# Patient Record
Sex: Female | Born: 1995 | Race: White | Hispanic: No | Marital: Single | Smoking: Never smoker
Health system: Southern US, Community
[De-identification: ages and names within clinical notes are randomized; demographics above are authoritative.]

## PROBLEM LIST (undated history)

## (undated) DIAGNOSIS — K219 Gastro-esophageal reflux disease without esophagitis: Secondary | ICD-10-CM

## (undated) DIAGNOSIS — J45909 Unspecified asthma, uncomplicated: Secondary | ICD-10-CM

## (undated) DIAGNOSIS — T7840XA Allergy, unspecified, initial encounter: Secondary | ICD-10-CM

## (undated) DIAGNOSIS — G43909 Migraine, unspecified, not intractable, without status migrainosus: Secondary | ICD-10-CM

## (undated) DIAGNOSIS — Z87440 Personal history of urinary (tract) infections: Secondary | ICD-10-CM

## (undated) DIAGNOSIS — R519 Headache, unspecified: Secondary | ICD-10-CM

## (undated) DIAGNOSIS — K921 Melena: Secondary | ICD-10-CM

## (undated) DIAGNOSIS — B019 Varicella without complication: Secondary | ICD-10-CM

## (undated) HISTORY — PX: WISDOM TOOTH EXTRACTION: SHX21

## (undated) HISTORY — DX: Melena: K92.1

## (undated) HISTORY — DX: Gastro-esophageal reflux disease without esophagitis: K21.9

## (undated) HISTORY — DX: Unspecified asthma, uncomplicated: J45.909

## (undated) HISTORY — DX: Headache, unspecified: R51.9

## (undated) HISTORY — DX: Varicella without complication: B01.9

## (undated) HISTORY — PX: MOLE REMOVAL: SHX2046

## (undated) HISTORY — PX: UPPER GASTROINTESTINAL ENDOSCOPY: SHX188

## (undated) HISTORY — DX: Personal history of urinary (tract) infections: Z87.440

## (undated) HISTORY — DX: Allergy, unspecified, initial encounter: T78.40XA

## (undated) HISTORY — DX: Migraine, unspecified, not intractable, without status migrainosus: G43.909

---
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
## 2017-01-20 DIAGNOSIS — J029 Acute pharyngitis, unspecified: Secondary | ICD-10-CM | POA: Diagnosis not present

## 2017-01-27 DIAGNOSIS — K006 Disturbances in tooth eruption: Secondary | ICD-10-CM | POA: Diagnosis not present

## 2017-01-27 DIAGNOSIS — K011 Impacted teeth: Secondary | ICD-10-CM | POA: Diagnosis not present

## 2017-06-16 DIAGNOSIS — Z111 Encounter for screening for respiratory tuberculosis: Secondary | ICD-10-CM | POA: Diagnosis not present

## 2017-06-17 DIAGNOSIS — K080 Exfoliation of teeth due to systemic causes: Secondary | ICD-10-CM | POA: Diagnosis not present

## 2017-06-23 DIAGNOSIS — K080 Exfoliation of teeth due to systemic causes: Secondary | ICD-10-CM | POA: Diagnosis not present

## 2017-06-28 DIAGNOSIS — Z0000 Encounter for general adult medical examination without abnormal findings: Secondary | ICD-10-CM | POA: Diagnosis not present

## 2017-06-28 DIAGNOSIS — Z111 Encounter for screening for respiratory tuberculosis: Secondary | ICD-10-CM | POA: Diagnosis not present

## 2017-08-05 DIAGNOSIS — L255 Unspecified contact dermatitis due to plants, except food: Secondary | ICD-10-CM | POA: Diagnosis not present

## 2017-09-07 DIAGNOSIS — L2389 Allergic contact dermatitis due to other agents: Secondary | ICD-10-CM | POA: Diagnosis not present

## 2017-11-25 DIAGNOSIS — Z5181 Encounter for therapeutic drug level monitoring: Secondary | ICD-10-CM | POA: Diagnosis not present

## 2017-11-25 DIAGNOSIS — Z23 Encounter for immunization: Secondary | ICD-10-CM | POA: Diagnosis not present

## 2017-11-25 DIAGNOSIS — D2271 Melanocytic nevi of right lower limb, including hip: Secondary | ICD-10-CM | POA: Diagnosis not present

## 2017-11-25 DIAGNOSIS — D225 Melanocytic nevi of trunk: Secondary | ICD-10-CM | POA: Diagnosis not present

## 2017-11-25 DIAGNOSIS — L700 Acne vulgaris: Secondary | ICD-10-CM | POA: Diagnosis not present

## 2018-01-27 DIAGNOSIS — K080 Exfoliation of teeth due to systemic causes: Secondary | ICD-10-CM | POA: Diagnosis not present

## 2018-02-11 DIAGNOSIS — L700 Acne vulgaris: Secondary | ICD-10-CM | POA: Diagnosis not present

## 2018-06-10 DIAGNOSIS — K30 Functional dyspepsia: Secondary | ICD-10-CM | POA: Diagnosis not present

## 2018-06-29 DIAGNOSIS — R1013 Epigastric pain: Secondary | ICD-10-CM | POA: Diagnosis not present

## 2018-06-29 DIAGNOSIS — R143 Flatulence: Secondary | ICD-10-CM | POA: Diagnosis not present

## 2018-08-03 DIAGNOSIS — K922 Gastrointestinal hemorrhage, unspecified: Secondary | ICD-10-CM | POA: Diagnosis not present

## 2018-09-02 DIAGNOSIS — B078 Other viral warts: Secondary | ICD-10-CM | POA: Diagnosis not present

## 2018-09-02 DIAGNOSIS — D2239 Melanocytic nevi of other parts of face: Secondary | ICD-10-CM | POA: Diagnosis not present

## 2018-09-02 DIAGNOSIS — D763 Other histiocytosis syndromes: Secondary | ICD-10-CM | POA: Diagnosis not present

## 2018-09-02 DIAGNOSIS — D485 Neoplasm of uncertain behavior of skin: Secondary | ICD-10-CM | POA: Diagnosis not present

## 2018-09-09 DIAGNOSIS — L918 Other hypertrophic disorders of the skin: Secondary | ICD-10-CM | POA: Diagnosis not present

## 2018-09-28 DIAGNOSIS — R05 Cough: Secondary | ICD-10-CM | POA: Diagnosis not present

## 2018-09-28 DIAGNOSIS — Z20828 Contact with and (suspected) exposure to other viral communicable diseases: Secondary | ICD-10-CM | POA: Diagnosis not present

## 2018-09-28 DIAGNOSIS — R0602 Shortness of breath: Secondary | ICD-10-CM | POA: Diagnosis not present

## 2018-10-13 ENCOUNTER — Telehealth: Payer: Self-pay

## 2018-10-13 NOTE — Telephone Encounter (Signed)

## 2018-10-14 ENCOUNTER — Other Ambulatory Visit: Payer: Self-pay

## 2018-10-14 ENCOUNTER — Ambulatory Visit (INDEPENDENT_AMBULATORY_CARE_PROVIDER_SITE_OTHER): Payer: Federal, State, Local not specified - PPO | Admitting: Plastic Surgery

## 2018-10-14 ENCOUNTER — Encounter: Payer: Self-pay | Admitting: Plastic Surgery

## 2018-10-14 DIAGNOSIS — L988 Other specified disorders of the skin and subcutaneous tissue: Secondary | ICD-10-CM

## 2018-10-14 NOTE — Progress Notes (Signed)
     Patient ID: Melissa Rivas, female    DOB: December 08, 1995, 23 y.o.   MRN: [id number]   Chief Complaint  Patient presents with  . Skin Problem    The patient is a 23 year old female here with her parents for evaluation of a skin lesion.  She had a changing skin lesion for several months to a year.  She was seen by dermatology and a biopsy was done.  It was found to be dysplastic.  It was less than a centimeter in size.  The pigment has returned.  It is 5 mm in size and located on the right cheek inferior to the earlobe.  There is a family history of skin cancer.  She is otherwise in excellent health.  She is a Electronics engineer at [employer].   Review of Systems  Constitutional: Negative for activity change and appetite change.  HENT: Negative.   Eyes: Negative.   Respiratory: Negative.  Negative for chest tightness and shortness of breath.   Cardiovascular: Negative for leg swelling.  Gastrointestinal: Negative for abdominal pain.  Endocrine: Negative.   Genitourinary: Negative.   Musculoskeletal: Negative.   Skin: Positive for color change.  Neurological: Negative.   Psychiatric/Behavioral: Negative.     History reviewed. No pertinent past medical history.  History reviewed. No pertinent surgical history.    Current Outpatient Medications:  .  famotidine (PEPCID) 20 MG tablet, Take 20 mg by mouth 2 (two) times daily., Disp: , Rfl:  .  norgestimate-ethinyl estradiol (ORTHO-CYCLEN) 0.25-35 MG-MCG tablet, Take 1 tablet by mouth daily., Disp: , Rfl:    Objective:   Vitals:   10/14/18 1346  BP: 115/80  Pulse: 74  Temp: 97.9 F (36.6 C)  SpO2: 98%    Physical Exam Vitals signs and nursing note reviewed.  Constitutional:      Appearance: Normal appearance.  HENT:     Head: Normocephalic and atraumatic.  Cardiovascular:     Rate and Rhythm: Normal rate.  Pulmonary:     Effort: Pulmonary effort is normal.  Abdominal:     General: Abdomen is flat. There is no distension.   Musculoskeletal: Normal range of motion.  Skin:    General: Skin is warm.  Neurological:     General: No focal deficit present.     Mental Status: She is alert and oriented to person, place, and time.  Psychiatric:        Mood and Affect: Mood normal.        Behavior: Behavior normal.        Thought Content: Thought content normal.     Assessment & Plan:  Dysplasia of lesion of skin  Plan for excision of dysplastic skin lesion in the clinic. Pictures were obtained of the patient and placed in the chart with the patient's or guardian's permission.  Hagan, DO

## 2018-11-24 ENCOUNTER — Telehealth: Payer: Self-pay

## 2018-11-24 NOTE — Telephone Encounter (Signed)

## 2018-11-25 ENCOUNTER — Encounter: Payer: Self-pay | Admitting: Plastic Surgery

## 2018-11-25 ENCOUNTER — Other Ambulatory Visit: Payer: Self-pay

## 2018-11-25 ENCOUNTER — Other Ambulatory Visit (HOSPITAL_COMMUNITY)
Admission: RE | Admit: 2018-11-25 | Discharge: 2018-11-25 | Disposition: A | Discharge status: Home / Self Care | Payer: Federal, State, Local not specified - PPO | Source: Ambulatory Visit | Attending: Plastic Surgery | Admitting: Plastic Surgery

## 2018-11-25 ENCOUNTER — Ambulatory Visit: Payer: Federal, State, Local not specified - PPO | Admitting: Plastic Surgery

## 2018-11-25 VITALS — BP 121/82 | HR 80 | Temp 98.0°F | Ht 68.0 in | Wt 150.6 lb

## 2018-11-25 DIAGNOSIS — L988 Other specified disorders of the skin and subcutaneous tissue: Secondary | ICD-10-CM | POA: Insufficient documentation

## 2018-11-25 DIAGNOSIS — D2339 Other benign neoplasm of skin of other parts of face: Secondary | ICD-10-CM | POA: Diagnosis not present

## 2018-11-25 NOTE — Progress Notes (Signed)
Procedure Note  Preoperative Dx: Dysplastic nevus right cheek  Postoperative Dx: Same  Procedure: Excision of dysplastic nevus right cheek 7 mm  Anesthesia: Lidocaine 1% with 1:100,000 epinepherine   Description of Procedure: Risks and complications were explained to the patient and mom.  Consent was confirmed and the patient understands the risks and benefits.  The potential complications and alternatives were explained and the patient consents.  The patient expressed understanding the option of not having the procedure and the risks of a scar.  Time out was called and all information was confirmed to be correct.    The area was prepped and drapped.  Lidocaine 1% with epinepherine was injected in the subcutaneous area.  After waiting several minutes for the local to take affect a #15 blade was used to excise the area in an eliptical pattern with 38mm borders from the biopsy site.  A 6-0 Monocryl was used to close the deep layers with simple interrupted stitches.  The skin edges were reapproximated with 5-0 Monocryl subcuticular running closure.  A dressing was applied.  The patient was given instructions on how to care for the area and a follow up appointment.  Xitlally tolerated the procedure well and there were no complications. The specimen was sent to pathology.

## 2018-11-29 LAB — SURGICAL PATHOLOGY

## 2018-11-30 ENCOUNTER — Telehealth: Payer: Self-pay | Admitting: Plastic Surgery

## 2018-11-30 NOTE — Telephone Encounter (Signed)
Mother, Alysson Geist, called to see if daughter's biopsy results are in yet. Please call and advise what results are. FYI- No HIPAA on file for mom for our office, but mom's # is [phone].

## 2018-12-01 ENCOUNTER — Telehealth: Payer: Self-pay

## 2018-12-01 NOTE — Telephone Encounter (Signed)
Call to pt's mom re: path report I informed her of the results per Dr. Dillingham-  & instructed her that Dr. Dillingham advised to observe the area for any changes & if changes are noted in the lesion tip- that she could re-excise that area at any time- & that there was nothing to be concerned with at this time Pt has f/u appointment on 12/02/18 with Matt- our PA Tarissa Kerin 

## 2018-12-01 NOTE — Telephone Encounter (Signed)
-----   Message from Wallace Going, DO sent at 12/01/2018 10:31 AM EDT ----- Can you let patient know following:  We got all but a small area at the tip.  Still nothing to worry about.  We can keep an eye on it.  If there is any skin change we should excise.  Otherwise we can do it at anytime.

## 2018-12-02 ENCOUNTER — Other Ambulatory Visit: Payer: Self-pay

## 2018-12-02 ENCOUNTER — Ambulatory Visit (INDEPENDENT_AMBULATORY_CARE_PROVIDER_SITE_OTHER): Payer: Federal, State, Local not specified - PPO | Admitting: Surgical

## 2018-12-02 ENCOUNTER — Encounter: Payer: Self-pay | Admitting: Surgical

## 2018-12-02 VITALS — BP 119/83 | HR 73 | Temp 98.6°F | Ht 68.0 in | Wt 152.6 lb

## 2018-12-02 DIAGNOSIS — L988 Other specified disorders of the skin and subcutaneous tissue: Secondary | ICD-10-CM

## 2018-12-02 NOTE — Progress Notes (Signed)
   Subjective:     Patient ID: Melissa Rivas, female    DOB: 03-04-1995, 23 y.o.   MRN: [id number]  Chief Complaint  Patient presents with  . Follow-up    from office excision    HPI: The patient is a 23 y.o. female here for follow-up after excision of dysplastic nevus of right cheek on 11/25/18 with Dr. Marla Roe. Patient is here with her mom.  Pathology showed: Residual nests and confluent areas of junctional melanocytes with subjacent scar are present, consistent with residual/persistent dysplastic nevus, extending to one tip and cross-sectional margins.   Patient healing well, incision c/d/i. Steri-strip in place. No surrounding erythema, dehiscence noted.  They are going for a check up with with dermatology for other lesions in December.  Review of Systems  Constitutional: Negative for activity change, appetite change, chills, diaphoresis, fatigue and fever.  HENT: Negative for tinnitus.   Skin: Negative for color change, pallor, rash and wound.  Neurological: Negative for dizziness, weakness and headaches.    Objective:   Vital Signs BP 119/83 (BP Location: Left Arm, Patient Position: Sitting, Cuff Size: Normal)   Pulse 73   Temp 98.6 F (37 C) (Temporal)   Ht 5\' 8"  (1.727 m)   Wt 152 lb 9.6 oz (69.2 kg)   LMP 11/14/2018 (Exact Date)   SpO2 98%   BMI 23.20 kg/m  Vital Signs and Nursing Note Reviewed  Physical Exam  Constitutional: She is oriented to person, place, and time and well-developed, well-nourished, and in no distress.  HENT:  Head: Normocephalic and atraumatic.    Cardiovascular: Normal rate.  Pulmonary/Chest: Effort normal.  Musculoskeletal: Normal range of motion.  Neurological: She is alert and oriented to person, place, and time. Gait normal.  Skin: Skin is warm and dry. No rash noted. She is not diaphoretic. No erythema. No pallor.  Psychiatric: Mood and affect normal.    Assessment/Plan:     ICD-10-CM   1. Dysplasia of lesion of skin   L98.8    Melissa Rivas is healing well.   Pathology showed residual dysplastic nevus cells. Patient has two options in regards to further management. We can wait and see for skin changes or move forward with additional excision to try and excise previously left dysplastic cells.  Mother and Patient are going to think it over and call us if they decide they would like to excise within the next few weeks or wait. Either plan will be appropriate, plan discussed with Dr. Marla Roe.  Vaseline to incision for 3-4 days.  Follow up as needed, call with decision/questions/concerns.   Carola Rhine Shanquita Ronning, PA-C 12/02/2018, 3:25 PM

## 2018-12-02 NOTE — Telephone Encounter (Signed)
Call to pt's mom re: path report I informed her of the results per Dr. Marla Roe-  & instructed her that Dr. Marla Roe advised to observe the area for any changes & if changes are noted in the lesion tip- that she could re-excise that area at any time- & that there was nothing to be concerned with at this time Pt has f/u appointment on 12/02/18 with Catalina Antigua- our [place]

## 2019-01-10 DIAGNOSIS — D280 Benign neoplasm of vulva: Secondary | ICD-10-CM | POA: Diagnosis not present

## 2019-01-10 DIAGNOSIS — N9089 Other specified noninflammatory disorders of vulva and perineum: Secondary | ICD-10-CM | POA: Diagnosis not present

## 2019-01-11 DIAGNOSIS — D485 Neoplasm of uncertain behavior of skin: Secondary | ICD-10-CM | POA: Diagnosis not present

## 2019-01-11 DIAGNOSIS — D225 Melanocytic nevi of trunk: Secondary | ICD-10-CM | POA: Diagnosis not present

## 2019-01-11 DIAGNOSIS — Z86018 Personal history of other benign neoplasm: Secondary | ICD-10-CM | POA: Diagnosis not present

## 2019-01-11 DIAGNOSIS — B078 Other viral warts: Secondary | ICD-10-CM | POA: Diagnosis not present

## 2019-01-11 DIAGNOSIS — D2272 Melanocytic nevi of left lower limb, including hip: Secondary | ICD-10-CM | POA: Diagnosis not present

## 2019-01-11 DIAGNOSIS — R234 Changes in skin texture: Secondary | ICD-10-CM | POA: Diagnosis not present

## 2019-01-24 ENCOUNTER — Encounter: Payer: Self-pay | Admitting: Physician Assistant

## 2019-01-24 ENCOUNTER — Ambulatory Visit: Payer: Federal, State, Local not specified - PPO | Admitting: Family Medicine

## 2019-01-24 ENCOUNTER — Other Ambulatory Visit: Payer: Self-pay

## 2019-01-24 ENCOUNTER — Encounter: Payer: Self-pay | Admitting: Family Medicine

## 2019-01-24 VITALS — BP 121/81 | HR 88 | Temp 99.1°F | Resp 16 | Ht 68.0 in | Wt 151.2 lb

## 2019-01-24 DIAGNOSIS — Z7689 Persons encountering health services in other specified circumstances: Secondary | ICD-10-CM

## 2019-01-24 DIAGNOSIS — R519 Headache, unspecified: Secondary | ICD-10-CM

## 2019-01-24 DIAGNOSIS — R0789 Other chest pain: Secondary | ICD-10-CM

## 2019-01-24 DIAGNOSIS — R194 Change in bowel habit: Secondary | ICD-10-CM | POA: Diagnosis not present

## 2019-01-24 DIAGNOSIS — K219 Gastro-esophageal reflux disease without esophagitis: Secondary | ICD-10-CM

## 2019-01-24 MED ORDER — SACCHAROMYCES BOULARDII 250 MG PO CAPS: 250.0000 mg | ORAL_CAPSULE | Freq: Two times a day (BID) | ORAL | 2 refills | Status: DC

## 2019-01-24 MED ORDER — FAMOTIDINE 20 MG PO TABS: 20.0000 mg | ORAL_TABLET | Freq: Two times a day (BID) | ORAL | 1 refills | Status: DC

## 2019-01-24 MED ORDER — OMEPRAZOLE 20 MG PO CPDR: 20.0000 mg | DELAYED_RELEASE_CAPSULE | Freq: Every day | ORAL | 2 refills | Status: DC

## 2019-01-24 NOTE — Patient Instructions (Addendum)
1. Start florastor probiotic every 12 hours 2. Avoid dairy  3. Restart omeprazole.  4. Continue famotidine.  5. Try low fodmap diet. Follow up in 4 weeks.    Food Choices for Gastroesophageal Reflux Disease, Adult When you have gastroesophageal reflux disease (GERD), the foods you eat and your eating habits are very important. Choosing the right foods can help ease your discomfort. Think about working with a nutrition specialist (dietitian) to help you make good choices. What are tips for following this plan?  Meals  Choose healthy foods that are low in fat, such as fruits, vegetables, whole grains, low-fat dairy products, and lean meat, fish, and poultry.  Eat small meals often instead of 3 large meals a day. Eat your meals slowly, and in a place where you are relaxed. Avoid bending over or lying down until 2-3 hours after eating.  Avoid eating meals 2-3 hours before bed.  Avoid drinking a lot of liquid with meals.  Cook foods using methods other than frying. Bake, grill, or broil food instead.  Avoid or limit: ? Chocolate. ? Peppermint or spearmint. ? Alcohol. ? Pepper. ? Black and decaffeinated coffee. ? Black and decaffeinated tea. ? Bubbly (carbonated) soft drinks. ? Caffeinated energy drinks and soft drinks.  Limit high-fat foods such as: ? Fatty meat or fried foods. ? Whole milk, cream, butter, or ice cream. ? Nuts and nut butters. ? Pastries, donuts, and sweets made with butter or shortening.  Avoid foods that cause symptoms. These foods may be different for everyone. Common foods that cause symptoms include: ? Tomatoes. ? Oranges, lemons, and limes. ? Peppers. ? Spicy food. ? Onions and garlic. ? Vinegar. Lifestyle  Maintain a healthy weight. Ask your doctor what weight is healthy for you. If you need to lose weight, work with your doctor to do so safely.  Exercise for at least 30 minutes for 5 or more days each week, or as told by your doctor.  Wear  loose-fitting clothes.  Do not smoke. If you need help quitting, ask your doctor.  Sleep with the head of your bed higher than your feet. Use a wedge under the mattress or blocks under the bed frame to raise the head of the bed. Summary  When you have gastroesophageal reflux disease (GERD), food and lifestyle choices are very important in easing your symptoms.  Eat small meals often instead of 3 large meals a day. Eat your meals slowly, and in a place where you are relaxed.  Limit high-fat foods such as fatty meat or fried foods.  Avoid bending over or lying down until 2-3 hours after eating.  Avoid peppermint and spearmint, caffeine, alcohol, and chocolate. This information is not intended to replace advice given to you by your health care provider. Make sure you discuss any questions you have with your health care provider. Document Released: 07/28/2011 Document Revised: 05/19/2018 Document Reviewed: 03/03/2016 Elsevier Patient Education  2020 [employer]   Low-FODMAP Eating Plan  FODMAPs (fermentable oligosaccharides, disaccharides, monosaccharides, and polyols) are sugars that are hard for some people to digest. A low-FODMAP eating plan may help some people who have bowel (intestinal) diseases to manage their symptoms. This meal plan can be complicated to follow. Work with a diet and nutrition specialist (dietitian) to make a low-FODMAP eating plan that is right for you. A dietitian can make sure that you get enough nutrition from this diet. What are tips for following this plan? Reading food labels  Check labels for hidden  FODMAPs such as: ? High-fructose syrup. ? Honey. ? Agave. ? Natural fruit flavors. ? Onion or garlic powder.  Choose low-FODMAP foods that contain 3-4 grams of fiber per serving.  Check food labels for serving sizes. Eat only one serving at a time to make sure FODMAP levels stay low. Meal planning  Follow a low-FODMAP eating plan for up to 6 weeks,  or as told by your health care provider or dietitian.  To follow the eating plan: 1. Eliminate high-FODMAP foods from your diet completely. 2. Gradually reintroduce high-FODMAP foods into your diet one at a time. Most people should wait a few days after introducing one high-FODMAP food before they introduce the next high-FODMAP food. Your dietitian can recommend how quickly you may reintroduce foods. 3. Keep a daily record of what you eat and drink, and make note of any symptoms that you have after eating. 4. Review your daily record with a dietitian regularly. Your dietitian can help you identify which foods you can eat and which foods you should avoid. General tips  Drink enough fluid each day to keep your urine pale yellow.  Avoid processed foods. These often have added sugar and may be high in FODMAPs.  Avoid most dairy products, whole grains, and sweeteners.  Work with a dietitian to make sure you get enough fiber in your diet. Recommended foods Grains  Gluten-free grains, such as rice, oats, buckwheat, quinoa, corn, polenta, and millet. Gluten-free pasta, bread, or cereal. Rice noodles. Corn tortillas. Vegetables  Eggplant, zucchini, cucumber, peppers, green beans, Brussels sprouts, bean sprouts, lettuce, arugula, kale, Swiss chard, spinach, collard greens, bok choy, summer squash, potato, and tomato. Limited amounts of corn, carrot, and sweet potato. Green parts of scallions. Fruits  Bananas, oranges, lemons, limes, blueberries, raspberries, strawberries, grapes, cantaloupe, honeydew melon, kiwi, papaya, passion fruit, and pineapple. Limited amounts of dried cranberries, banana chips, and shredded coconut. Dairy  Lactose-free milk, yogurt, and kefir. Lactose-free cottage cheese and ice cream. Non-dairy milks, such as almond, coconut, hemp, and rice milk. Yogurts made of non-dairy milks. Limited amounts of goat cheese, brie, mozzarella, parmesan, swiss, and other hard  cheeses. Meats and other protein foods  Unseasoned beef, pork, poultry, or fish. Eggs. Tomasa Blase. Tofu (firm) and tempeh. Limited amounts of nuts and seeds, such as almonds, walnuts, [place] nuts, pecans, peanuts, pumpkin seeds, chia seeds, and sunflower seeds. Fats and oils  Butter-free spreads. Vegetable oils, such as olive, canola, and sunflower oil. Seasoning and other foods  Artificial sweeteners with names that do not end in "ol" such as aspartame, saccharine, and stevia. Maple syrup, white table sugar, raw sugar, brown sugar, and molasses. Fresh basil, coriander, parsley, rosemary, and thyme. Beverages  Water and mineral water. Sugar-sweetened soft drinks. Small amounts of orange juice or cranberry juice. Black and green tea. Most dry wines. Coffee. This may not be a complete list of low-FODMAP foods. Talk with your dietitian for more information. Foods to avoid Grains  Wheat, including kamut, durum, and semolina. Barley and bulgur. Couscous. Wheat-based cereals. Wheat noodles, bread, crackers, and pastries. Vegetables  Chicory root, artichoke, asparagus, cabbage, snow peas, sugar snap peas, mushrooms, and cauliflower. Onions, garlic, leeks, and the white part of scallions. Fruits  Fresh, dried, and juiced forms of apple, pear, watermelon, peach, plum, cherries, apricots, blackberries, boysenberries, figs, nectarines, and mango. Avocado. Dairy  Milk, yogurt, ice cream, and soft cheese. Cream and sour cream. Milk-based sauces. Custard. Meats and other protein foods  Fried or fatty meat. Sausage. Cashews and  pistachios. Soybeans, baked beans, black beans, chickpeas, kidney beans, fava beans, navy beans, lentils, and split peas. Seasoning and other foods  Any sugar-free gum or candy. Foods that contain artificial sweeteners such as sorbitol, mannitol, isomalt, or xylitol. Foods that contain honey, high-fructose corn syrup, or agave. Bouillon, vegetable stock, beef stock, and chicken  stock. Garlic and onion powder. Condiments made with onion, such as hummus, chutney, pickles, relish, salad dressing, and salsa. Tomato paste. Beverages  Chicory-based drinks. Coffee substitutes. Chamomile tea. Fennel tea. Sweet or fortified wines such as port or sherry. Diet soft drinks made with isomalt, mannitol, maltitol, sorbitol, or xylitol. Apple, pear, and mango juice. Juices with high-fructose corn syrup. This may not be a complete list of high-FODMAP foods. Talk with your dietitian to discuss what dietary choices are best for you.  Summary  A low-FODMAP eating plan is a short-term diet that eliminates FODMAPs from your diet to help ease symptoms of certain bowel diseases.  The eating plan usually lasts up to 6 weeks. After that, high-FODMAP foods are restarted gradually, one at a time, so you can find out which may be causing symptoms.  A low-FODMAP eating plan can be complicated. It is best to work with a dietitian who has experience with this type of plan. This information is not intended to replace advice given to you by your health care provider. Make sure you discuss any questions you have with your health care provider. Document Released: 09/22/2016 Document Revised: 01/08/2017 Document Reviewed: 09/22/2016 Elsevier Patient Education  2020 [employer].

## 2019-01-24 NOTE — Progress Notes (Signed)
Patient ID: Melissa Rivas, female  DOB: [birth year], 23 y.o.   MRN: [id number] Patient Care Team    Relationship Specialty Notifications Start End  Natalia LeatherwoodKuneff, Huntington Leverich A, DO PCP - General Family Medicine  01/24/19     Chief Complaint  Patient presents with  . Establish Care    Prior [facility], [facility]- [facility], Pt had GI MD for GERD but he has retired.     Subjective:  Melissa Rivas is a 23 y.o.  female present for new patient establishment. All past medical history, surgical history, allergies, family history, immunizations, medications and social history were updated in the electronic medical record today. All recent labs, ED visits and hospitalizations within the last year were reviewed.  Chest discomfort: Patient reports she has had increasing chest discomfort.  She reports shooting pains in her chest when walking.  She reports increased symptoms and "pressure "after eating dinner.  She feels the pressure goes up into her throat.  She has noticed an increase in indigestion, but denies routine heartburn.  She endorses wine will cause burning in her stomach and increase her symptoms.  [facility] 1.5 years ago and [facility].  She states she was started on omeprazole for a few months.  When she went to GI they had started her on famotidine.  She does not recall using these medications subcutaneously.  She does endorse also having fluctuating bowel movements between loose stools and constipation, without many normal stools reported.  She denies any fever, chills, nausea or vomit.  She denies any shortness of breath or radiation of chest pain.  She denies any unintentional weight loss.  She does endorse having some anxiety.  She is in college and reports stress surrounding school work and the pandemic.  She thinks she is coping okay to the anxiety.  She also has a history of asthma without use of albuterol or daily  inhaler. Patient also reports a history of migraines that might be becoming more frequent.  She also reports having a occasional "."in the center of her visual field.  She denies any other changes in her vision or light sensitivity.  Depression screen PHQ 2/9 01/24/2019  Decreased Interest 0  Down, Depressed, Hopeless 0  PHQ - 2 Score 0   GAD 7 : Generalized Anxiety Score 01/24/2019  Nervous, Anxious, on Edge 0  Control/stop worrying 0  Worry too much - different things 0  Trouble relaxing 0  Restless 0  Easily annoyed or irritable 0  Afraid - awful might happen 0  Total GAD 7 Score 0  Anxiety Difficulty Not difficult at all       No flowsheet data found.  Immunization History  Administered Date(s) Administered  . Influenza-Unspecified 11/10/2018  . Tdap 07/01/2009   No exam data present  Past Medical History:  Diagnosis Date  . Allergy   . Asthma    Childhood   . Blood in stool   . Chicken pox   . Frequent headaches   . GERD (gastroesophageal reflux disease)   . History of UTI   . Migraines    Allergies  Allergen Reactions  . Azithromycin Nausea And Vomiting   Past Surgical History:  Procedure Laterality Date  . MOLE REMOVAL    . WISDOM TOOTH EXTRACTION     Family History  Problem Relation Age of Onset  . Arthritis Mother   . Asthma Father   .  Arthritis Father   . Diabetes Father   . Hyperlipidemia Father   . Hypertension Father   . Arthritis Maternal Grandmother   . Stroke Maternal Grandmother   . Arthritis Maternal Grandfather   . Cancer Maternal Grandfather   . Prostate cancer Maternal Grandfather   . Bone cancer Maternal Grandfather   . Heart disease Maternal Grandfather   . Hyperlipidemia Maternal Grandfather   . Hypertension Maternal Grandfather   . Arthritis Paternal Grandmother   . Asthma Paternal Grandmother   . Mental illness Paternal Grandmother    Social History   Social History Narrative  . Not on file    Allergies as of  01/24/2019       Reactions   Azithromycin Nausea And Vomiting        Medication List        Accurate as of January 24, 2019  1:22 PM. If you have any questions, ask your nurse or doctor.          famotidine 20 MG tablet Commonly known as: PEPCID Take 20 mg by mouth 2 (two) times daily.   lactase 3000 units tablet Commonly known as: LACTAID Take by mouth 3 (three) times daily with meals.   norgestimate-ethinyl estradiol 0.25-35 MG-MCG tablet Commonly known as: ORTHO-CYCLEN Take 1 tablet by mouth daily.   simethicone 125 MG chewable tablet Commonly known as: MYLICON Chew 250 mg by mouth every 6 (six) hours as needed for flatulence.   tretinoin 0.025 % gel Commonly known as: RETIN-A Apply topically at bedtime.        All past medical history, surgical history, allergies, family history, immunizations andmedications were updated in the EMR today and reviewed under the history and medication portions of their EMR.     No results found.   ROS: 14 pt review of systems performed and negative (unless mentioned in an HPI)  Objective: BP 121/81 (BP Location: Left Arm, Patient Position: Sitting, Cuff Size: Normal)   Pulse 88   Temp 99.1 F (37.3 C) (Temporal)   Resp 16   Ht 5\' 8"  (1.727 m)   Wt 151 lb 4 oz (68.6 kg)   LMP 01/11/2019 (Exact Date)   SpO2 97%   BMI 23.00 kg/m  Gen: Afebrile. No acute distress. Nontoxic in appearance, well-developed, well-nourished, pleasant, Caucasian female HENT: AT. .  Eyes:Pupils Equal Round Reactive to light, Extraocular movements intact,  Conjunctiva without redness, discharge or icterus. Neck/lymp/endocrine: Supple,no lymphadenopathy, no thyromegaly CV: RRR  Chest: CTAB, no wheeze, rhonchi or crackles. Abd: Soft. NTND. BS present.  Skin:  Warm and well-perfused. Skin intact. Neuro/Msk:  Normal gait. PERLA. EOMi. Alert. Oriented x3.   Psych: mildly anxious. Normal affect, dress and demeanor. Normal speech. Normal thought  content and judgment.  Assessment/plan: Melissa Rivas is a 23 y.o. female present for est/gerd.  Gastroesophageal reflux disease, unspecified whether esophagitis present/Chest discomfort Suspect her symptoms are worsening reflux.  We will increase her therapy regimen.  Have her follow a GERD diet Restart omeprazole and continue Pepcid. GERD diet discussed and provided to patient. Chest discomfort when walking may be from her asthma more so than reflux.  It does not seem like she is prescribed a controller inhaler or uses her albuterol. We will collect H. pylori IgG at upcoming physical, and if positive would treat since she reports no prior history of H. pylori.  Bowel habit changes Possibly irritable bowel syndrome.  Sounds like she fluctuates between constipation and diarrhea.   start with Florastor probiotic  which was prescribed for her today and encouraged FODMAP diet. Avoidance of all dairy for now. She has an upcoming CPE this week will check her thyroid panel at that time.  Headaches: Was not able to cover her headaches in full detail today.  Encouraged her to make a headache diary.  Also encouraged her to see her eye doctor soon as possible given eye symptom as a first step.  Follow-up in 4 weeks.  If symptoms are not improving would consider further laboratory evaluation at that time   Greater than 45 minutes was spent with patient, greater than 50% of that time was spent face-to-face     Note is dictated utilizing voice recognition software. Although note has been proof read prior to signing, occasional typographical errors still can be missed. If any questions arise, please do not hesitate to call for verification.  Electronically signed by: Howard Pouch, DO [place]

## 2019-01-27 ENCOUNTER — Ambulatory Visit (INDEPENDENT_AMBULATORY_CARE_PROVIDER_SITE_OTHER): Payer: Federal, State, Local not specified - PPO | Admitting: Family Medicine

## 2019-01-27 ENCOUNTER — Other Ambulatory Visit: Payer: Self-pay

## 2019-01-27 ENCOUNTER — Encounter: Payer: Self-pay | Admitting: Family Medicine

## 2019-01-27 VITALS — BP 115/81 | HR 86 | Temp 97.9°F | Resp 16 | Ht 68.0 in | Wt 150.1 lb

## 2019-01-27 DIAGNOSIS — F418 Other specified anxiety disorders: Secondary | ICD-10-CM

## 2019-01-27 DIAGNOSIS — Z131 Encounter for screening for diabetes mellitus: Secondary | ICD-10-CM

## 2019-01-27 DIAGNOSIS — R1013 Epigastric pain: Secondary | ICD-10-CM

## 2019-01-27 DIAGNOSIS — J4520 Mild intermittent asthma, uncomplicated: Secondary | ICD-10-CM

## 2019-01-27 DIAGNOSIS — R0789 Other chest pain: Secondary | ICD-10-CM | POA: Diagnosis not present

## 2019-01-27 DIAGNOSIS — K219 Gastro-esophageal reflux disease without esophagitis: Secondary | ICD-10-CM | POA: Diagnosis not present

## 2019-01-27 DIAGNOSIS — Z130 Encounter for screening for diseases of the blood and blood-forming organs and certain disorders involving the immune mechanism: Secondary | ICD-10-CM | POA: Diagnosis not present

## 2019-01-27 DIAGNOSIS — Z23 Encounter for immunization: Secondary | ICD-10-CM

## 2019-01-27 DIAGNOSIS — Z0000 Encounter for general adult medical examination without abnormal findings: Secondary | ICD-10-CM | POA: Diagnosis not present

## 2019-01-27 DIAGNOSIS — Z79899 Other long term (current) drug therapy: Secondary | ICD-10-CM

## 2019-01-27 DIAGNOSIS — R194 Change in bowel habit: Secondary | ICD-10-CM

## 2019-01-27 LAB — LIPID PANEL
Cholesterol: 218 mg/dL — ABNORMAL HIGH (ref 0–200)
HDL: 56.7 mg/dL (ref >39.00)
LDL Cholesterol: 139 mg/dL — ABNORMAL HIGH (ref 0–99)
NonHDL: 160.95
Total CHOL/HDL Ratio: 4
Triglycerides: 109 mg/dL (ref 0.0–149.0)
VLDL: 21.8 mg/dL (ref 0.0–40.0)

## 2019-01-27 LAB — COMPREHENSIVE METABOLIC PANEL
ALT: 11 U/L (ref 0–35)
AST: 14 U/L (ref 0–37)
Albumin: 4.3 g/dL (ref 3.5–5.2)
Alkaline Phosphatase: 36 U/L — ABNORMAL LOW (ref 39–117)
BUN: 11 mg/dL (ref 6–23)
CO2: 29 mEq/L (ref 19–32)
Calcium: 9.3 mg/dL (ref 8.4–10.5)
Chloride: 104 mEq/L (ref 96–112)
Creatinine, Ser: 0.86 mg/dL (ref 0.40–1.20)
GFR: 83.22 mL/min (ref >60.00)
Glucose, Bld: 86 mg/dL (ref 70–99)
Potassium: 4.3 mEq/L (ref 3.5–5.1)
Sodium: 138 mEq/L (ref 135–145)
Total Bilirubin: 0.4 mg/dL (ref 0.2–1.2)
Total Protein: 6.8 g/dL (ref 6.0–8.3)

## 2019-01-27 LAB — CBC
HCT: 39.4 % (ref 36.0–46.0)
Hemoglobin: 13 g/dL (ref 12.0–15.0)
MCHC: 33 g/dL (ref 30.0–36.0)
MCV: 88.9 fl (ref 78.0–100.0)
Platelets: 260 10*3/uL (ref 150.0–400.0)
RBC: 4.43 Mil/uL (ref 3.87–5.11)
RDW: 12.6 % (ref 11.5–15.5)
WBC: 5.3 10*3/uL (ref 4.0–10.5)

## 2019-01-27 LAB — TSH: TSH: 1.89 u[IU]/mL (ref 0.35–4.50)

## 2019-01-27 LAB — H. PYLORI ANTIBODY, IGG: H Pylori IgG: NEGATIVE

## 2019-01-27 LAB — HEMOGLOBIN A1C: Hgb A1c MFr Bld: 5.3 % (ref 4.6–6.5)

## 2019-01-27 NOTE — Patient Instructions (Signed)
Health Maintenance, Female Adopting a healthy lifestyle and getting preventive care are important in promoting health and wellness. Ask your health care provider about:  The right schedule for you to have regular tests and exams.  Things you can do on your own to prevent diseases and keep yourself healthy. What should I know about diet, weight, and exercise? Eat a healthy diet   Eat a diet that includes plenty of vegetables, fruits, low-fat dairy products, and lean protein.  Do not eat a lot of foods that are high in solid fats, added sugars, or sodium. Maintain a healthy weight Body mass index (BMI) is used to identify weight problems. It estimates body fat based on height and weight. Your health care provider can help determine your BMI and help you achieve or maintain a healthy weight. Get regular exercise Get regular exercise. This is one of the most important things you can do for your health. Most adults should:  Exercise for at least 150 minutes each week. The exercise should increase your heart rate and make you sweat (moderate-intensity exercise).  Do strengthening exercises at least twice a week. This is in addition to the moderate-intensity exercise.  Spend less time sitting. Even light physical activity can be beneficial. Watch cholesterol and blood lipids Have your blood tested for lipids and cholesterol at 20 years of age, then have this test every 5 years. Have your cholesterol levels checked more often if:  Your lipid or cholesterol levels are high.  You are older than 21 years of age.  You are at high risk for heart disease. What should I know about cancer screening? Depending on your health history and family history, you may need to have cancer screening at various ages. This may include screening for:  Breast cancer.  Cervical cancer.  Colorectal cancer.  Skin cancer.  Lung cancer. What should I know about heart disease, diabetes, and high blood  pressure? Blood pressure and heart disease  High blood pressure causes heart disease and increases the risk of stroke. This is more likely to develop in people who have high blood pressure readings, are of African descent, or are overweight.  Have your blood pressure checked: ? Every 3-5 years if you are 18-39 years of age. ? Every year if you are 40 years old or older. Diabetes Have regular diabetes screenings. This checks your fasting blood sugar level. Have the screening done:  Once every three years after age 40 if you are at a normal weight and have a low risk for diabetes.  More often and at a younger age if you are overweight or have a high risk for diabetes. What should I know about preventing infection? Hepatitis B If you have a higher risk for hepatitis B, you should be screened for this virus. Talk with your health care provider to find out if you are at risk for hepatitis B infection. Hepatitis C Testing is recommended for:  Everyone born from 1945 through 1965.  Anyone with known risk factors for hepatitis C. Sexually transmitted infections (STIs)  Get screened for STIs, including gonorrhea and chlamydia, if: ? You are sexually active and are younger than 21 years of age. ? You are older than 21 years of age and your health care provider tells you that you are at risk for this type of infection. ? Your sexual activity has changed since you were last screened, and you are at increased risk for chlamydia or gonorrhea. Ask your health care provider if   you are at risk.  Ask your health care provider about whether you are at high risk for HIV. Your health care provider may recommend a prescription medicine to help prevent HIV infection. If you choose to take medicine to prevent HIV, you should first get tested for HIV. You should then be tested every 3 months for as long as you are taking the medicine. Pregnancy  If you are about to stop having your period (premenopausal) and  you may become pregnant, seek counseling before you get pregnant.  Take 400 to 800 micrograms (mcg) of folic acid every day if you become pregnant.  Ask for birth control (contraception) if you want to prevent pregnancy. Osteoporosis and menopause Osteoporosis is a disease in which the bones lose minerals and strength with aging. This can result in bone fractures. If you are 65 years old or older, or if you are at risk for osteoporosis and fractures, ask your health care provider if you should:  Be screened for bone loss.  Take a calcium or vitamin D supplement to lower your risk of fractures.  Be given hormone replacement therapy (HRT) to treat symptoms of menopause. Follow these instructions at home: Lifestyle  Do not use any products that contain nicotine or tobacco, such as cigarettes, e-cigarettes, and chewing tobacco. If you need help quitting, ask your health care provider.  Do not use street drugs.  Do not share needles.  Ask your health care provider for help if you need support or information about quitting drugs. Alcohol use  Do not drink alcohol if: ? Your health care provider tells you not to drink. ? You are pregnant, may be pregnant, or are planning to become pregnant.  If you drink alcohol: ? Limit how much you use to 0-1 drink a day. ? Limit intake if you are breastfeeding.  Be aware of how much alcohol is in your drink. In the [place] one drink equals one 12 oz bottle of beer (355 mL), one 5 oz glass of wine (148 mL), or one 1 oz glass of hard liquor (44 mL). General instructions  Schedule regular health, dental, and eye exams.  Stay current with your vaccines.  Tell your health care provider if: ? You often feel depressed. ? You have ever been abused or do not feel safe at home. Summary  Adopting a healthy lifestyle and getting preventive care are important in promoting health and wellness.  Follow your health care provider's instructions about healthy  diet, exercising, and getting tested or screened for diseases.  Follow your health care provider's instructions on monitoring your cholesterol and blood pressure. This information is not intended to replace advice given to you by your health care provider. Make sure you discuss any questions you have with your health care provider. Document Released: 08/11/2010 Document Revised: 01/19/2018 Document Reviewed: 01/19/2018 Elsevier Patient Education  2020 Elsevier Inc.  

## 2019-01-27 NOTE — Progress Notes (Signed)
This visit occurred during the SARS-CoV-2 public health emergency.  Safety protocols were in place, including screening questions prior to the visit, additional usage of staff PPE, and extensive cleaning of exam room while observing appropriate contact time as indicated for disinfecting solutions.    Patient ID: Melissa Rivas, female  DOB: 06-16-1995, 23 y.o.   MRN: [id number] Patient Care Team    Relationship Specialty Notifications Start End  Ma Hillock, DO PCP - General Family Medicine  01/24/19   Jari Pigg, MD Consulting Physician Dermatology  01/29/19   Otelia Sergeant, OD Referring Physician   01/29/19   Gastroenterology, [facility]    01/29/19     Chief Complaint  Patient presents with  . Annual Exam    Fasting. Pt is going to [facility] for GYN services     Subjective:  Melissa Rivas is a 23 y.o.  Female  present for CPE. All past medical history, surgical history, allergies, family history, immunizations, medications and social history were updated in the electronic medical record today. All recent labs, ED visits and hospitalizations within the last year were reviewed. Patient's last menstrual period was 01/11/2019 (exact date).  Health maintenance:  Colonoscopy: No fhx. Routine screen 47 Mammogram: no fhx. Routine screen 42 Cervical cancer screening: start  PAP this yr has gyn. Discussed.  Immunizations: tdap ~2011> updated today, Influenza UTD 2020 Infectious disease screening: HIV/g/c N/a never SA Assistive device: none Oxygen XEN:MMHW Patient has a Dental home. Hospitalizations/ED visits: reviewed  Depression screen [facility] 2/9 01/27/2019 01/24/2019  Decreased Interest 0 0  Down, Depressed, Hopeless 0 0  PHQ - 2 Score 0 0   GAD 7 : Generalized Anxiety Score 01/24/2019  Nervous, Anxious, on Edge 0  Control/stop worrying 0  Worry too much - different things 0  Trouble relaxing 0  Restless 0  Easily annoyed or irritable 0  Afraid - awful might happen  0  Total GAD 7 Score 0  Anxiety Difficulty Not difficult at all    Immunization History  Administered Date(s) Administered  . DTaP 03/13/1998, 05/06/1998, 07/10/1998, 04/04/1999, 02/23/2003  . Hepatitis A 10/14/2005, 06/04/2006  . Hepatitis B 07/10/1998, 09/23/1998, 01/16/2000  . HiB (PRP-OMP) 03/13/1998, 05/06/1998, 07/10/1998, 04/04/1999  . Hpv 03/10/2013, 05/11/2013, 09/11/2013  . IPV 03/13/1998, 05/06/1998, 12/31/1998, 02/23/2003  . Influenza,inj,Quad PF,6+ Mos 12/02/2018  . Influenza-Unspecified 11/10/2018  . MMR 12/31/1998, 02/23/2003  . Meningococcal Conjugate 09/21/2011  . Meningococcal Polysaccharide 03/22/2015  . Td 07/01/2009, 01/27/2019  . Tdap 07/01/2009    Past Medical History:  Diagnosis Date  . Allergy   . Asthma    Childhood   . Blood in stool   . Chicken pox   . Frequent headaches   . GERD (gastroesophageal reflux disease)   . History of UTI   . Migraines    Allergies  Allergen Reactions  . Azithromycin Nausea And Vomiting   Past Surgical History:  Procedure Laterality Date  . MOLE REMOVAL     Patient reports severely dysplastic mole.  . WISDOM TOOTH EXTRACTION     Family History  Problem Relation Age of Onset  . Arthritis Mother   . Asthma Father   . Arthritis Father   . Diabetes Father   . Hyperlipidemia Father   . Hypertension Father   . Arthritis Maternal Grandmother   . Stroke Maternal Grandmother   . Arthritis Maternal Grandfather   . Prostate cancer Maternal Grandfather   . Bone cancer Maternal Grandfather   . Heart disease Maternal  Grandfather   . Hyperlipidemia Maternal Grandfather   . Hypertension Maternal Grandfather   . Arthritis Paternal Grandmother   . Asthma Paternal Grandmother   . Mental illness Paternal Grandmother    Social History   Social History Narrative   Marital status/children/pets: Single.  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   Safety:      -Wears a bicycle helmet riding a bike:  Yes     -smoke alarm in the home:Yes     - wears seatbelt: Yes     - Feels safe in their relationships: Yes    Allergies as of 01/27/2019      Reactions   Azithromycin Nausea And Vomiting      Medication List       Accurate as of January 27, 2019 11:59 PM. If you have any questions, ask your nurse or doctor.        albuterol 108 (90 Base) MCG/ACT inhaler Commonly known as: VENTOLIN HFA Inhale 1-2 puffs prior to exercising and may use for wheezing or shortness of breath every 6 hours as needed. Started by: Howard Pouch, DO   famotidine 20 MG tablet Commonly known as: PEPCID Take 1 tablet (20 mg total) by mouth 2 (two) times daily.   lactase 3000 units tablet Commonly known as: LACTAID Take by mouth 3 (three) times daily with meals.   norgestimate-ethinyl estradiol 0.25-35 MG-MCG tablet Commonly known as: ORTHO-CYCLEN Take 1 tablet by mouth daily.   omeprazole 20 MG capsule Commonly known as: PRILOSEC Take 1 capsule (20 mg total) by mouth daily.   saccharomyces boulardii 250 MG capsule Commonly known as: Florastor Take 1 capsule (250 mg total) by mouth 2 (two) times daily.   simethicone 125 MG chewable tablet Commonly known as: MYLICON Chew 774 mg by mouth every 6 (six) hours as needed for flatulence.   tretinoin 0.025 % gel Commonly known as: RETIN-A Apply topically at bedtime.       All past medical history, surgical history, allergies, family history, immunizations andmedications were updated in the EMR today and reviewed under the history and medication portions of their EMR.      No results found.   ROS: 14 pt review of systems performed and negative (unless mentioned in an HPI)  Objective: BP 115/81 (BP Location: Left Arm, Patient Position: Sitting, Cuff Size: Normal)   Pulse 86   Temp 97.9 F (36.6 C) (Temporal)   Resp 16   Ht '5\' 8"'  (1.727 m)   Wt 150 lb 2 oz (68.1 kg)   LMP 01/11/2019 (Exact Date)   SpO2 98%   BMI 22.83 kg/m  Gen:  Afebrile. No acute distress. Nontoxic in appearance, well-developed, well-nourished, very pleasant, Caucasian female. HENT: AT. [place]. Bilateral TM visualized and normal in appearance, normal external auditory canal. MMM, no oral lesions, adequate dentition. Bilateral nares within normal limits. Throat without erythema, ulcerations or exudates.  No cough on exam, no hoarseness on exam. Eyes:Pupils Equal Round Reactive to light, Extraocular movements intact,  Conjunctiva without redness, discharge or icterus. Neck/lymp/endocrine: Supple, no lymphadenopathy, no thyromegaly CV: RRR no murmur, no edema, +2/4 P posterior tibialis pulses.  Chest: CTAB, no wheeze, rhonchi or crackles.  Normal respiratory effort.  Good air movement. Abd: Soft.  Flat. NTND. BS present.  No masses palpated. No hepatosplenomegaly. No rebound tenderness or guarding. Skin: No rashes, purpura or petechiae. Warm and well-perfused. Skin intact. Neuro/Msk:  Normal gait. PERLA. EOMi. Alert. Oriented x3.  Cranial nerves II through XII intact.  Muscle strength 5/5 upper/lower extremity. DTRs equal bilaterally. Psych: Normal affect, dress and demeanor. Normal speech. Normal thought content and judgment.   No exam data present  Assessment/plan: Melissa Rivas is a 23 y.o. female present for CPE Situational anxiety/bowel habit changes - TSH Encounter for long-term current use of medication - Comp Met (CMET) - Lipid panel Screening for deficiency anemia - CBC Screening for diabetes mellitus - HgB A1c Epigastric discomfort /Gastroesophageal reflux disease without esophagitis -Continue the omeprazole and Pepcid regimen.  Continue Florastor and following the FODMAP diet. - CBC - Comp Met (CMET) - H. pylori antibody, IgG -Patient was encouraged to follow-up in 4 weeks from her establishment appointment in which further evaluation will be completed at that time. -Patient had concerns of long-term PPI.  If needing long-term PPI would  monitor vitamin D, B12 and magnesium.  Advised some people do need to take a PPI in order to control symptoms long-term.  Chest discomfort/asthma history Records received after appt- pt has a h/o asthma. Chest discomfort with walking may be more reflective of her asthma. Possibly separate issue from GERD. She will be called in albuterol and encouraged to use 1-2 puffs before exercise to see if this relieves her symptoms when walking.  - CBC - Comp Met (CMET)  Need for Td vaccine - Td : Tetanus/diphtheria >7yo Preservative  free  Encounter for preventive health examination Patient was encouraged to exercise greater than 150 minutes a week. Patient was encouraged to choose a diet filled with fresh fruits and vegetables, and lean meats. AVS provided to patient today for education/recommendation on gender specific health and safety maintenance. Colonoscopy: No fhx. Routine screen 47 Mammogram: no fhx. Routine screen 42 Cervical cancer screening: start  PAP this yr has gyn. Discussed.  Immunizations: tdap ~2011> updated today, Influenza UTD 2020 Infectious disease screening: HIV/g/c N/a never SA   Return in 3-4 weeks for follow-up on acute concerns Return in about 1 year (around 01/27/2020) for CPE (30 min).  Orders Placed This Encounter  Procedures  . Td : Tetanus/diphtheria >7yo Preservative  free  . CBC  . Comp Met (CMET)  . HgB A1c  . Lipid panel  . TSH  . H. pylori antibody, IgG     Electronically signed by: Howard Pouch, [place]

## 2019-01-29 ENCOUNTER — Encounter: Payer: Self-pay | Admitting: Family Medicine

## 2019-01-29 ENCOUNTER — Telehealth: Payer: Self-pay | Admitting: Family Medicine

## 2019-01-29 DIAGNOSIS — K219 Gastro-esophageal reflux disease without esophagitis: Secondary | ICD-10-CM | POA: Insufficient documentation

## 2019-01-29 DIAGNOSIS — R1013 Epigastric pain: Secondary | ICD-10-CM | POA: Insufficient documentation

## 2019-01-29 DIAGNOSIS — R519 Headache, unspecified: Secondary | ICD-10-CM | POA: Insufficient documentation

## 2019-01-29 DIAGNOSIS — R0789 Other chest pain: Secondary | ICD-10-CM | POA: Insufficient documentation

## 2019-01-29 DIAGNOSIS — J45909 Unspecified asthma, uncomplicated: Secondary | ICD-10-CM | POA: Insufficient documentation

## 2019-01-29 DIAGNOSIS — R194 Change in bowel habit: Secondary | ICD-10-CM | POA: Insufficient documentation

## 2019-01-29 HISTORY — DX: Gastro-esophageal reflux disease without esophagitis: K21.9

## 2019-01-29 MED ORDER — ALBUTEROL SULFATE HFA 108 (90 BASE) MCG/ACT IN AERS: INHALATION_SPRAY | RESPIRATORY_TRACT | 5 refills | Status: DC

## 2019-01-29 NOTE — Telephone Encounter (Signed)
Please call patient and inform her the following information: In further review of her records and lab results, I have noted she did not list albuterol on her medication list on establishment.  I did call this in for her and would like her to take 1 to 2 puffs prior to walking and see if that relieves her symptoms she is experiencing when she is walking/exercising. I believe her discomfort when walking in her discomfort after meals may be 2 separate issues both reflux and asthma.  If she gains relief with the use of her albuterol before walking, she is likely having exercise-induced asthma flares and she can continue albuterol 1 to 2 puffs about 15 to 20 minutes before exercise to prevent those symptoms. Please make sure she has follow-up in 3-4 weeks on her GERD/chest discomfort

## 2019-01-30 NOTE — Telephone Encounter (Signed)
Pt was called and given information/instructions. She asked to call back to make appt. She verbalized understanding

## 2019-01-31 ENCOUNTER — Telehealth: Payer: Self-pay | Admitting: Plastic Surgery

## 2019-01-31 NOTE — Telephone Encounter (Signed)
LVM for pt to call office back. I was calling to advise pt that Dr. Marla Roe would evaluate new lesions when she comes for her procedure on the 5th, but no answ. We will obtain the PA for the new spots just in case she is able to do them the same day.

## 2019-02-01 ENCOUNTER — Encounter: Payer: Self-pay | Admitting: Family Medicine

## 2019-02-13 ENCOUNTER — Telehealth: Payer: Self-pay | Admitting: Plastic Surgery

## 2019-02-13 NOTE — Telephone Encounter (Signed)
error 

## 2019-02-14 ENCOUNTER — Encounter: Payer: Self-pay | Admitting: Plastic Surgery

## 2019-02-14 ENCOUNTER — Other Ambulatory Visit (HOSPITAL_COMMUNITY)
Admission: RE | Admit: 2019-02-14 | Discharge: 2019-02-14 | Disposition: A | Discharge status: Home / Self Care | Payer: Federal, State, Local not specified - PPO | Source: Ambulatory Visit | Attending: Plastic Surgery | Admitting: Plastic Surgery

## 2019-02-14 ENCOUNTER — Ambulatory Visit (INDEPENDENT_AMBULATORY_CARE_PROVIDER_SITE_OTHER): Payer: Federal, State, Local not specified - PPO | Admitting: Plastic Surgery

## 2019-02-14 ENCOUNTER — Other Ambulatory Visit: Payer: Self-pay

## 2019-02-14 VITALS — BP 117/82 | HR 92 | Temp 98.9°F | Ht 68.0 in | Wt 152.0 lb

## 2019-02-14 DIAGNOSIS — L988 Other specified disorders of the skin and subcutaneous tissue: Secondary | ICD-10-CM

## 2019-02-14 DIAGNOSIS — D2339 Other benign neoplasm of skin of other parts of face: Secondary | ICD-10-CM | POA: Diagnosis not present

## 2019-02-14 DIAGNOSIS — D2372 Other benign neoplasm of skin of left lower limb, including hip: Secondary | ICD-10-CM | POA: Diagnosis not present

## 2019-02-14 NOTE — Progress Notes (Signed)
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 Excision of right cheek dysplasia 6 mm 2. Excision of left leg dysplasia 1.5 cm  Anesthesia: Lidocaine 1% with 1:100,000 epinepherine   Description of Procedure: Risks and complications were explained to the patient.  Consent was confirmed and the patient understands the risks and benefits.  The potential complications and alternatives were explained and the patient consents.  The patient expressed understanding the option of not having the procedure and the risks of a scar.  Time out was called and all information was confirmed to be correct.    Right cheek:  The area was prepped and drapped.  Lidocaine 1% with epinepherine was injected in the subcutaneous area.  After waiting several minutes for the local to take affect a #15 blade was used to excise the area in an eliptical pattern with 80mm borders from the biopsy site.  A 5-0 Monocryl was used to close the deep layers with simple interrupted stitches.  The skin edges were reapproximated with 5-0 Monocryl. A dressing was applied.    Left leg:  The area was prepped and drapped.  Lidocaine 1% with epinepherine was injected in the subcutaneous area.  After waiting several minutes for the local to take affect a #15 blade was used to excise the area in an eliptical pattern with 77mm borders from the biopsy site.  A 5-0 Monocryl was used to close the deep layers with simple interrupted stitches.  The skin edges were reapproximated with 5-0 Monocryl.  A dressing was applied.  The patient was given instructions on how to care for the area and a follow up appointment.  Melissa Rivas tolerated the procedure well and there were no complications. The specimens were sent to pathology.   The 21st Century Cures Act was signed into law in 2016 which includes the topic of electronic health records.  This provides immediate access to information in MyChart.   This includes consultation notes, operative notes, office notes, lab results and pathology reports.  If you have any questions about what you read please let us know at your next visit or call us at the office.  We are right here with you.

## 2019-02-17 LAB — SURGICAL PATHOLOGY

## 2019-02-20 ENCOUNTER — Telehealth: Payer: Self-pay

## 2019-02-20 NOTE — Telephone Encounter (Signed)
Call to pt- no answer-unable to leave voicemail 

## 2019-02-21 ENCOUNTER — Other Ambulatory Visit: Payer: Self-pay

## 2019-02-21 ENCOUNTER — Ambulatory Visit (INDEPENDENT_AMBULATORY_CARE_PROVIDER_SITE_OTHER): Payer: Federal, State, Local not specified - PPO | Admitting: Plastic Surgery

## 2019-02-21 ENCOUNTER — Ambulatory Visit: Payer: Federal, State, Local not specified - PPO | Attending: Internal Medicine

## 2019-02-21 ENCOUNTER — Ambulatory Visit: Payer: Federal, State, Local not specified - PPO | Admitting: Plastic Surgery

## 2019-02-21 ENCOUNTER — Encounter: Payer: Self-pay | Admitting: Plastic Surgery

## 2019-02-21 VITALS — BP 121/82 | HR 85 | Temp 97.8°F | Ht 68.0 in | Wt 153.2 lb

## 2019-02-21 DIAGNOSIS — Z20822 Contact with and (suspected) exposure to covid-19: Secondary | ICD-10-CM | POA: Diagnosis not present

## 2019-02-21 DIAGNOSIS — L988 Other specified disorders of the skin and subcutaneous tissue: Secondary | ICD-10-CM

## 2019-02-21 NOTE — Progress Notes (Signed)
The patient is a 24 year old female here for follow-up after undergoing excision of a right cheek and left leg dysplastic nevus.  All margins were free of any dysplasia.  She is healing very well.  I removed the stitch from her right cheek.  I had like the stitch in her left leg to stay for 1 more week.  If she goes back to school she can take it out on her own.  We are happy to have her come and will remove it for her at any time.  She should still use the compression socks.  Sunblock when out in the sun and start Mederma in 1 week.

## 2019-02-23 ENCOUNTER — Encounter: Payer: Self-pay | Admitting: Family Medicine

## 2019-02-23 ENCOUNTER — Other Ambulatory Visit: Payer: Self-pay

## 2019-02-23 ENCOUNTER — Ambulatory Visit: Payer: Federal, State, Local not specified - PPO | Admitting: Family Medicine

## 2019-02-23 VITALS — BP 106/72 | HR 89 | Temp 97.7°F | Resp 17 | Ht 68.0 in | Wt 153.2 lb

## 2019-02-23 DIAGNOSIS — R1013 Epigastric pain: Secondary | ICD-10-CM

## 2019-02-23 DIAGNOSIS — K219 Gastro-esophageal reflux disease without esophagitis: Secondary | ICD-10-CM

## 2019-02-23 DIAGNOSIS — R194 Change in bowel habit: Secondary | ICD-10-CM

## 2019-02-23 LAB — NOVEL CORONAVIRUS, NAA: SARS-CoV-2, NAA: NOT DETECTED

## 2019-02-23 MED ORDER — OMEPRAZOLE 20 MG PO CPDR: 20.0000 mg | DELAYED_RELEASE_CAPSULE | Freq: Every day | ORAL | 3 refills | Status: DC

## 2019-02-23 MED ORDER — SACCHAROMYCES BOULARDII 250 MG PO CAPS: 250.0000 mg | ORAL_CAPSULE | Freq: Two times a day (BID) | ORAL | 3 refills | Status: DC

## 2019-02-23 MED ORDER — FAMOTIDINE 20 MG PO TABS: 20.0000 mg | ORAL_TABLET | Freq: Two times a day (BID) | ORAL | 3 refills | Status: DC

## 2019-02-23 NOTE — Progress Notes (Signed)
Patient ID: Melissa Rivas, female  DOB: 07-28-1995, 24 y.o.   MRN: [id number] Patient Care Team    Relationship Specialty Notifications Start End  Ma Hillock, DO PCP - General Family Medicine  01/24/19   Jari Pigg, MD Consulting Physician Dermatology  01/29/19   Otelia Sergeant, OD Referring Physician   01/29/19   [facility], [facility]    01/29/19     Chief Complaint  Patient presents with  . Gastroesophageal Reflux    Pt states she feels it has improved since starting medication but is still having heartburn, taste in mouth daily    Subjective: Melissa Rivas is a 24 y.o.  female present for follow  Chest discomfort/GERD:  Patient presents today to follow-up on her chest discomfort and bowel habit changes.  She reports since starting the Florastor probiotic her bowel habits have regulated nicely.  She states using it twice daily has allowed her to have normal regular bowel movements. She reports the chest discomfort and mild shortness of breath has completely resolved.  She states she has had one episode of heartburn after eating chocolate.  She also had 1 day of frequent reflux in which she is uncertain what could have caused that day to be particularly worse.  She does endorse increased activity shortly after eating and bending over. Prior note:  Patient reports she has had increasing chest discomfort.  She reports shooting pains in her chest when walking.  She reports increased symptoms and "pressure "after eating dinner.  She feels the pressure goes up into her throat.  She has noticed an increase in indigestion, but denies routine heartburn.  She endorses wine will cause burning in her stomach and increase her symptoms.  She was evaluated by [facility] approximately 1.5 years ago and went to [facility] for reflux symptoms.  She states she was started on omeprazole for a few months.  When she went to GI they had started her on famotidine.  She does not recall using these  medications subcutaneously.  She does endorse also having fluctuating bowel movements between loose stools and constipation, without many normal stools reported.  She denies any fever, chills, nausea or vomit.  She denies any shortness of breath or radiation of chest pain.  She denies any unintentional weight loss.  She does endorse having some anxiety.  She is in college and reports stress surrounding school work and the pandemic.  She thinks she is coping okay to the anxiety.  She also has a history of asthma without use of albuterol or daily inhaler. Patient also reports a history of migraines that might be becoming more frequent.  She also reports having a occasional "."in the center of her visual field.  She denies any other changes in her vision or light sensitivity.  Depression screen [facility] 2/9 01/27/2019 01/24/2019  Decreased Interest 0 0  Down, Depressed, Hopeless 0 0  PHQ - 2 Score 0 0   GAD 7 : Generalized Anxiety Score 01/24/2019  Nervous, Anxious, on Edge 0  Control/stop worrying 0  Worry too much - different things 0  Trouble relaxing 0  Restless 0  Easily annoyed or irritable 0  Afraid - awful might happen 0  Total GAD 7 Score 0  Anxiety Difficulty Not difficult at all       No flowsheet data found.  Immunization History  Administered Date(s) Administered  . DTaP 03/13/1998, 05/06/1998, 07/10/1998, 04/04/1999, 02/23/2003  . Hepatitis A 10/14/2005, 06/04/2006  . Hepatitis  B 07/10/1998, 09/23/1998, 01/16/2000  . HiB (PRP-OMP) 03/13/1998, 05/06/1998, 07/10/1998, 04/04/1999  . Hpv 03/10/2013, 05/11/2013, 09/11/2013  . IPV 03/13/1998, 05/06/1998, 12/31/1998, 02/23/2003  . Influenza,inj,Quad PF,6+ Mos 12/02/2018  . Influenza-Unspecified 11/10/2018  . MMR 12/31/1998, 02/23/2003  . Meningococcal Conjugate 09/21/2011  . Meningococcal Polysaccharide 03/22/2015  . Td 07/01/2009, 01/27/2019  . Tdap 07/01/2009   No exam data present  Past Medical History:  Diagnosis Date  .  Allergy   . Asthma    Childhood   . Blood in stool   . Chicken pox   . Frequent headaches   . GERD (gastroesophageal reflux disease)   . History of UTI   . Migraines    Allergies  Allergen Reactions  . Azithromycin Nausea And Vomiting   Past Surgical History:  Procedure Laterality Date  . MOLE REMOVAL     Patient reports severely dysplastic mole.  . WISDOM TOOTH EXTRACTION     Family History  Problem Relation Age of Onset  . Arthritis Mother   . Asthma Father   . Arthritis Father   . Diabetes Father   . Hyperlipidemia Father   . Hypertension Father   . Arthritis Maternal Grandmother   . Stroke Maternal Grandmother   . Arthritis Maternal Grandfather   . Prostate cancer Maternal Grandfather   . Bone cancer Maternal Grandfather   . Heart disease Maternal Grandfather   . Hyperlipidemia Maternal Grandfather   . Hypertension Maternal Grandfather   . Arthritis Paternal Grandmother   . Asthma Paternal Grandmother   . Mental illness Paternal Grandmother    Social History   Social History Narrative   Marital status/children/pets: Single.  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   Safety:      -Wears a bicycle helmet riding a bike: Yes     -smoke alarm in the home:Yes     - wears seatbelt: Yes     - Feels safe in their relationships: Yes    Allergies as of 02/23/2019      Reactions   Azithromycin Nausea And Vomiting      Medication List       Accurate as of February 23, 2019 10:34 AM. If you have any questions, ask your nurse or doctor.        albuterol 108 (90 Base) MCG/ACT inhaler Commonly known as: VENTOLIN HFA Inhale 1-2 puffs prior to exercising and may use for wheezing or shortness of breath every 6 hours as needed.   famotidine 20 MG tablet Commonly known as: PEPCID Take 1 tablet (20 mg total) by mouth 2 (two) times daily.   lactase 3000 units tablet Commonly known as: LACTAID Take by mouth 3 (three) times daily with meals.     norgestimate-ethinyl estradiol 0.25-35 MG-MCG tablet Commonly known as: ORTHO-CYCLEN Take 1 tablet by mouth daily.   omeprazole 20 MG capsule Commonly known as: PRILOSEC Take 1 capsule (20 mg total) by mouth daily.   saccharomyces boulardii 250 MG capsule Commonly known as: Florastor Take 1 capsule (250 mg total) by mouth 2 (two) times daily.   simethicone 125 MG chewable tablet Commonly known as: MYLICON Chew 112 mg by mouth every 6 (six) hours as needed for flatulence.   tretinoin 0.025 % gel Commonly known as: RETIN-A Apply topically at bedtime.       All past medical history, surgical history, allergies, family history, immunizations andmedications were updated in the EMR today and reviewed under the history and medication portions of their EMR.  No results found.   ROS: 14 pt review of systems performed and negative (unless mentioned in an HPI)  Objective: BP 106/72 (BP Location: Right Arm, Patient Position: Sitting, Cuff Size: Normal)   Pulse 89   Temp 97.7 F (36.5 C) (Temporal)   Resp 17   Ht '5\' 8"'  (1.727 m)   Wt 153 lb 4 oz (69.5 kg)   LMP 02/15/2019 (Exact Date)   SpO2 98%   BMI 23.30 kg/m  Gen: Afebrile. No acute distress. Appears well.  HENT: AT. [place]. Eyes:Pupils Equal Round Reactive to light, Extraocular movements intact,  Conjunctiva without redness, discharge or icterus. CV: RRR  Chest: CTAB, no wheeze or crackles Abd: Soft.  NTND. BS present   Assessment/plan: Melissa Rivas is a 24 y.o. female present for est/gerd.  Gastroesophageal reflux disease, unspecified whether esophagitis present/Chest discomfort Improving on regimen.  Continue the omeprazole and Pepcid regimen.   Continue Florastor and following the FODMAP/GERD diet. Discussed possibility of needing meds daily. Can attempt to taper off at 3 mos. If symptoms return restart med. Enough refills provided today to allow her to do so.  - if remains on PPI long term vit d , b12 and Mag  check every 2-3 years.  - f/u yearly with physical ok as long as doing well.   Bowel habit changes Continue Florastor probiotic - she is seeing improvements with use. which Continue  FODMAP and GERD diet. Avoidance of all dairy for now. F/U Yearly with CPE ok if doing well.   No orders of the defined types were placed in this encounter.  Meds ordered this encounter  Medications  . omeprazole (PRILOSEC) 20 MG capsule    Sig: Take 1 capsule (20 mg total) by mouth daily.    Dispense:  90 capsule    Refill:  3  . saccharomyces boulardii (FLORASTOR) 250 MG capsule    Sig: Take 1 capsule (250 mg total) by mouth 2 (two) times daily.    Dispense:  180 capsule    Refill:  3  . famotidine (PEPCID) 20 MG tablet    Sig: Take 1 tablet (20 mg total) by mouth 2 (two) times daily.    Dispense:  180 tablet    Refill:  3       Note is dictated utilizing voice recognition software. Although note has been proof read prior to signing, occasional typographical errors still can be missed. If any questions arise, please do not hesitate to call for verification.  Electronically signed by: Howard Pouch, DO [place]

## 2019-02-23 NOTE — Patient Instructions (Signed)
I am glad you are doing better. I have refilled your meds for you.     COVID vaccines are starting for the community.  1. Health Department: Appointments are required .Once your age bracket is called (watch news and visit website below for info) appt can be made by calling [phone] and selecting option 2.  Walk-ins will not be accepted.                  Clinic locations are: [facility], [address] [place]; [facility] [address], [address] [place]; . [facility], [address], [place]. Visit [email or web address] and click on the "COVID-19 Vaccine Info" rectangle for more information about vaccinations.  2. [place] web page will also have up to date info on vaccinations offered through [facility] at  [email or web address]. - Appt also required.  - vaccines are administered at the [employer] ([facility] women[facility] hosp). . If website indicates you are eligible for vaccine- please call [phone]   3. [facility]  -This will be done according to the rollout plan published by the [facility] of Health and [employer] (NCDHHS). The first public phase, also known as "Phase 1(b)", is for individuals 75 and older. Location: [facility] (Health and Chief Strategy Officer) [address]; [place] [address] -  All citizens receiving a vaccine must complete a COVID-19 consent form. To lessen your wait time, these can be found and completed ahead of time at [email or web address] or you may pick up a paper copy at the [facility] [phone] 65 in [place]) or at [employer] - Future dates and locations will be widely shared through the [facility] of Public Health's COVID-19 Hotline at [phone],  media outlets in the [place] and through the [employer] ([email or web address] and [email or web address]). Citizens can also contact the Department of Health and Human Services at [phone] for more information   Participants are asked to wear a face covering at vaccination sites.

## 2019-09-29 DIAGNOSIS — T63624A Toxic effect of contact with other jellyfish, undetermined, initial encounter: Secondary | ICD-10-CM | POA: Diagnosis not present

## 2019-09-29 DIAGNOSIS — Z6823 Body mass index (BMI) 23.0-23.9, adult: Secondary | ICD-10-CM | POA: Diagnosis not present

## 2019-10-05 ENCOUNTER — Telehealth: Payer: Self-pay

## 2019-10-05 NOTE — Telephone Encounter (Signed)
Please call regarding Tdap.   [phone]

## 2019-10-06 NOTE — Addendum Note (Signed)
Addended by: Eulah Pont on: 10/06/2019 03:16 PM   Modules accepted: Orders

## 2019-10-06 NOTE — Telephone Encounter (Signed)
Patient just needed some documents showing she had her CPE 01/27/2019 for school.  Printed off her summary and placed at front desk.  Also, printed immunization summary in case needed.

## 2019-10-09 NOTE — Telephone Encounter (Signed)
Records mailed today.

## 2019-10-18 DIAGNOSIS — Z111 Encounter for screening for respiratory tuberculosis: Secondary | ICD-10-CM | POA: Diagnosis not present

## 2019-10-30 DIAGNOSIS — Z111 Encounter for screening for respiratory tuberculosis: Secondary | ICD-10-CM | POA: Diagnosis not present

## 2020-01-05 DIAGNOSIS — Z20822 Contact with and (suspected) exposure to covid-19: Secondary | ICD-10-CM | POA: Diagnosis not present

## 2020-01-08 ENCOUNTER — Telehealth: Payer: Self-pay

## 2020-01-08 DIAGNOSIS — K219 Gastro-esophageal reflux disease without esophagitis: Secondary | ICD-10-CM

## 2020-01-08 NOTE — Telephone Encounter (Signed)
Patient mom Herbert Seta (DPR) calling for patient who is grad school. Mom states patient has been experiencing a lot of acid reflux this year and would like to be seen by a specialist.  Patient last seen on 02/23/19 by Dr. Claiborne Billings regarding this problem.  Patient request referral to Dr. Earlie Raveling  Herbert Seta can be reached at [phone] for questions and/or appt details. Patient is in class during work hours and cannot be reached.

## 2020-01-08 NOTE — Telephone Encounter (Signed)
Referral placed.

## 2020-01-08 NOTE — Telephone Encounter (Signed)
May place referral to GI she is requesting for GERD

## 2020-01-08 NOTE — Telephone Encounter (Signed)
Is this okay? Or will pt need a f/u appt?

## 2020-01-08 NOTE — Addendum Note (Signed)
Addended by: Maxie Barb on: 01/08/2020 04:00 PM   Modules accepted: Orders

## 2020-01-12 ENCOUNTER — Encounter: Payer: Self-pay | Admitting: Gastroenterology

## 2020-01-15 ENCOUNTER — Encounter: Payer: Self-pay | Admitting: Gastroenterology

## 2020-01-25 ENCOUNTER — Encounter: Payer: Self-pay | Admitting: Gastroenterology

## 2020-01-25 ENCOUNTER — Ambulatory Visit: Payer: Federal, State, Local not specified - PPO | Admitting: Gastroenterology

## 2020-01-25 VITALS — BP 110/72 | HR 87 | Ht 68.0 in | Wt 153.5 lb

## 2020-01-25 DIAGNOSIS — R110 Nausea: Secondary | ICD-10-CM

## 2020-01-25 DIAGNOSIS — R198 Other specified symptoms and signs involving the digestive system and abdomen: Secondary | ICD-10-CM

## 2020-01-25 DIAGNOSIS — R0989 Other specified symptoms and signs involving the circulatory and respiratory systems: Secondary | ICD-10-CM

## 2020-01-25 DIAGNOSIS — R053 Chronic cough: Secondary | ICD-10-CM

## 2020-01-25 DIAGNOSIS — D225 Melanocytic nevi of trunk: Secondary | ICD-10-CM | POA: Diagnosis not present

## 2020-01-25 DIAGNOSIS — K219 Gastro-esophageal reflux disease without esophagitis: Secondary | ICD-10-CM | POA: Diagnosis not present

## 2020-01-25 DIAGNOSIS — L578 Other skin changes due to chronic exposure to nonionizing radiation: Secondary | ICD-10-CM | POA: Diagnosis not present

## 2020-01-25 NOTE — Patient Instructions (Addendum)
If you are age 22 or older, your body mass index should be between 23-30. Your Body mass index is 23.34 kg/m. If this is out of the aforementioned range listed, please consider follow up with your Primary Care Provider.  If you are age 46 or younger, your body mass index should be between 19-25. Your Body mass index is 23.34 kg/m. If this is out of the aformentioned range listed, please consider follow up with your Primary Care Provider.  It has been recommended to you by your physician that you have a(n)Bravo endoscopy completed. Per your request, we did not schedule the procedure(s) today. Due to your tight schedule before returning to school, I will speak with Dr Barron Alvine and see if we might be able to work around the schedule  Due to recent changes in healthcare laws, you may see the results of your imaging and laboratory studies on MyChart before your provider has had a chance to review them.  We understand that in some cases there may be results that are confusing or concerning to you. Not all laboratory results come back in the same time frame and the provider may be waiting for multiple results in order to interpret others.  Please give [place] 48 hours in order for your provider to thoroughly review all the results before contacting the office for clarification of your results.   Thank you for choosing me and [facility].  Vito Cirigliano, D.O.

## 2020-01-25 NOTE — Progress Notes (Signed)
Chief Complaint: GERD  Referring Provider:     Natalia Leatherwood, DO   HPI:    Melissa Rivas is a 24 y.o. female referred to the Gastroenterology Clinic for evaluation of GERD.  Index symptoms mild heartburn, noncardiac chest pain, dry cough, globus sensation, nausea. No dysphagia. Sxs started ~2018, initially treated with omeprazole x3 months, with good response.    Previously evaluated by Dr. Randa Evens at [facility] in 2016 for n/v and hematemesis (all resolved). Follow-up with him in 2019 for the reflux sxs; no new meds or w/u but she reports discussing antireflux surgery.   She eventually stopped taking omeprazole due to concerns regarding osteoporosis and family history of osteoporosis (mother, 2 maternal aunts, maternal grandmother all with osteoporosis) and changed to Pepcid. Still taking Pepcid 20 mg bid but still with breakthrough sxs of cough, globus, nausea.   Stopped dairy with some improvement.   Labs from 01/2019: -H. pylori negative, normal CBC, CMP, TSH  -UGI series (08/2014): Normal  No previous EGD or Colonoscopy.   Past Medical History:  Diagnosis Date  . Allergy   . Asthma    Childhood   . Blood in stool   . Chicken pox   . Frequent headaches   . GERD (gastroesophageal reflux disease)   . GI bleed 2016  . History of UTI   . Migraines      Past Surgical History:  Procedure Laterality Date  . MOLE REMOVAL     Patient reports severely dysplastic mole.  . WISDOM TOOTH EXTRACTION     Family History  Problem Relation Age of Onset  . Arthritis Mother   . Osteoporosis Mother   . Asthma Father   . Arthritis Father   . Diabetes Father   . Hyperlipidemia Father   . Hypertension Father   . Colon polyps Father   . Arthritis Maternal Grandmother   . Stroke Maternal Grandmother   . Arthritis Maternal Grandfather   . Prostate cancer Maternal Grandfather   . Bone cancer Maternal Grandfather   . Heart disease Maternal Grandfather   .  Hyperlipidemia Maternal Grandfather   . Hypertension Maternal Grandfather   . Arthritis Paternal Grandmother   . Asthma Paternal Grandmother   . Mental illness Paternal Grandmother   . Colon cancer Paternal Great-grandmother   . Osteoporosis Maternal Aunt   . Osteoporosis Maternal Aunt   . Esophageal cancer Neg Hx    Social History   Tobacco Use  . Smoking status: Never Smoker  . Smokeless tobacco: Never Used  Vaping Use  . Vaping Use: Never used  Substance Use Topics  . Alcohol use: Yes    Comment: socially   . Drug use: Never   Current Outpatient Medications  Medication Sig Dispense Refill  . albuterol (VENTOLIN HFA) 108 (90 Base) MCG/ACT inhaler Inhale 1-2 puffs prior to exercising and may use for wheezing or shortness of breath every 6 hours as needed. 8 g 5  . famotidine (PEPCID) 20 MG tablet Take 1 tablet (20 mg total) by mouth 2 (two) times daily. 180 tablet 3  . lactase (LACTAID) 3000 units tablet Take by mouth 3 (three) times daily with meals.    . Multiple Vitamin (MULTIVITAMIN) tablet Take 1 tablet by mouth daily.    . norgestimate-ethinyl estradiol (ORTHO-CYCLEN) 0.25-35 MG-MCG tablet Take 1 tablet by mouth daily.    [facility] saccharomyces boulardii (FLORASTOR) 250 MG capsule Take 1 capsule (250  mg total) by mouth 2 (two) times daily. 180 capsule 3  . simethicone (MYLICON) 125 MG chewable tablet Chew 250 mg by mouth every 6 (six) hours as needed for flatulence.    . tretinoin (RETIN-A) 0.025 % gel Apply topically at bedtime.    [facility] omeprazole (PRILOSEC) 20 MG capsule Take 1 capsule (20 mg total) by mouth daily. (Patient not taking: Reported on 01/25/2020) 90 capsule 3   No current facility-administered medications for this visit.   Allergies  Allergen Reactions  . Azithromycin Nausea And Vomiting     Review of Systems: All systems reviewed and negative except where noted in HPI.     Physical Exam:    Wt Readings from Last 3 Encounters:  01/25/20 153 lb 8 oz (69.6  kg)  02/23/19 153 lb 4 oz (69.5 kg)  02/21/19 153 lb 3.2 oz (69.5 kg)    BP 110/72   Pulse 87   Ht 5\' 8"  (1.727 m)   Wt 153 lb 8 oz (69.6 kg)   BMI 23.34 kg/m  Constitutional:  Pleasant, in no acute distress. Psychiatric: Normal mood and affect. Behavior is normal. EENT: Pupils normal.  Conjunctivae are normal. No scleral icterus. Neck supple. No cervical LAD. Cardiovascular: Normal rate, regular rhythm. No edema Pulmonary/chest: Effort normal and breath sounds normal. No wheezing, rales or rhonchi. Abdominal: Soft, nondistended, nontender. Bowel sounds active throughout. There are no masses palpable. No hepatomegaly. Neurological: Alert and oriented to person place and time. Skin: Skin is warm and dry. No rashes noted.   ASSESSMENT AND PLAN;   1) GERD 2) Chronic cough 3) Globus sensation 4) Nausea  Discussed pathophysiology of reflux outlined today, to include typical vs atypical symptomatology, plan as below:  - EGD with Bravo (off H2RA x7 days) -Evaluate for erosive esophagitis, LES laxity, hiatal hernia at time of EGD -Discussed risks, benefits, alternatives of long-term acid suppression therapy, to include medication ADRs -Briefly discussed antireflux surgical options, to include TIF, Nissen fundoplication -RTC after EGD to discuss ongoing management  The indications, risks, and benefits of EGD with Bravo placement were explained to the patient in detail. Risks include but are not limited to bleeding, perforation, adverse reaction to medications, and cardiopulmonary compromise. Sequelae include but are not limited to the possibility of surgery, hositalization, and mortality. The patient verbalized understanding and wished to proceed. All questions answered, referred to scheduler. Further recommendations pending results of the exam.      , DO, FACG  01/25/2020, 9:16 AM   Kuneff, Renee A, DO

## 2020-01-26 ENCOUNTER — Telehealth: Payer: Self-pay | Admitting: Gastroenterology

## 2020-01-29 ENCOUNTER — Encounter: Payer: Self-pay | Admitting: General Surgery

## 2020-01-29 ENCOUNTER — Other Ambulatory Visit: Payer: Self-pay | Admitting: General Surgery

## 2020-01-29 DIAGNOSIS — R0989 Other specified symptoms and signs involving the circulatory and respiratory systems: Secondary | ICD-10-CM

## 2020-01-29 DIAGNOSIS — K219 Gastro-esophageal reflux disease without esophagitis: Secondary | ICD-10-CM

## 2020-01-29 DIAGNOSIS — R198 Other specified symptoms and signs involving the digestive system and abdomen: Secondary | ICD-10-CM

## 2020-01-29 NOTE — Telephone Encounter (Signed)
Spoke with the patient and she is okay to do the Bravo 02/16/2020 @730  and having the monitor removed on 02/19/2020.

## 2020-01-29 NOTE — Telephone Encounter (Signed)
Spoke with Huntley Dec In the [facility], ok to schedule the patient at 730am for her Bravo. Left voicemail for patient to call and confirm plan.

## 2020-01-29 NOTE — Telephone Encounter (Signed)
Left a message for the patient to call back regarding scheduling her BRAVO

## 2020-01-29 NOTE — Telephone Encounter (Signed)
Is this something you are working on.Looks like she didn't want to schedule at the time of her office visit

## 2020-02-07 DIAGNOSIS — D225 Melanocytic nevi of trunk: Secondary | ICD-10-CM | POA: Diagnosis not present

## 2020-02-07 DIAGNOSIS — D2239 Melanocytic nevi of other parts of face: Secondary | ICD-10-CM | POA: Diagnosis not present

## 2020-02-07 DIAGNOSIS — D485 Neoplasm of uncertain behavior of skin: Secondary | ICD-10-CM | POA: Diagnosis not present

## 2020-02-07 NOTE — Telephone Encounter (Signed)
Patient called to see if she could have someone else bring back to monitor on Monday she needs to go back to school Monday morning.

## 2020-02-08 ENCOUNTER — Encounter: Payer: Self-pay | Admitting: Gastroenterology

## 2020-02-12 NOTE — Telephone Encounter (Signed)
Yes, the Bravo will record for 48 hours, then the monitoring device can be returned the following day by someone else.

## 2020-02-13 NOTE — Telephone Encounter (Signed)
Notified the patient it was okay for someone to drop off the monitor on Monday morning.

## 2020-02-16 ENCOUNTER — Ambulatory Visit (AMBULATORY_SURGERY_CENTER): Payer: Federal, State, Local not specified - PPO | Admitting: Gastroenterology

## 2020-02-16 ENCOUNTER — Encounter: Payer: Self-pay | Admitting: Gastroenterology

## 2020-02-16 ENCOUNTER — Other Ambulatory Visit: Payer: Self-pay

## 2020-02-16 ENCOUNTER — Telehealth: Payer: Self-pay

## 2020-02-16 VITALS — BP 110/77 | HR 97 | Temp 97.7°F | Resp 17 | Ht 68.0 in | Wt 153.0 lb

## 2020-02-16 DIAGNOSIS — K2990 Gastroduodenitis, unspecified, without bleeding: Secondary | ICD-10-CM | POA: Diagnosis not present

## 2020-02-16 DIAGNOSIS — K219 Gastro-esophageal reflux disease without esophagitis: Secondary | ICD-10-CM

## 2020-02-16 DIAGNOSIS — K3189 Other diseases of stomach and duodenum: Secondary | ICD-10-CM | POA: Diagnosis not present

## 2020-02-16 DIAGNOSIS — K2970 Gastritis, unspecified, without bleeding: Secondary | ICD-10-CM | POA: Diagnosis not present

## 2020-02-16 MED ORDER — SODIUM CHLORIDE 0.9 % IV SOLN: 500.0000 mL | Freq: Once | INTRAVENOUS | Status: DC

## 2020-02-16 NOTE — Patient Instructions (Signed)
Continue holding acid suppression therapy ( pepcid) for the duration of the study, then can resume as previously prescribed   Post-op Bravo pH instructions Once you get home:  Eat normally and go about your daily routine/activities Limit drinking fluids or eating between meals Do not chew gum or eat hard candy DO NOT take any antacid or anti-reflux medications during the 48-hour monitoring time, unless instructed by your physician  Recording events: Events to be recorded are:  Record using event buttons on recorder and write on paper diary form 1. Every time you eat or drink something (other than water) 2.   Periods of lying down/reclining 3.  Symptoms:  may include heartburn, regurgitation, chest pain, cough or specify if other.  A paper diary is also provided to record the times of your reflux symptoms and times for meals and when you lie down.  The recorder needs to remain within 3 feet (arms length) of you during the testing period (48 hours). If you should forget and move outside of a 3-foot radius of the receiver you may hear beeping and you will see a "C1" error in the display window on the top of the receiver.  Please pick up the receiver and hold close to you to re-establish the connection and the error message disappears.  You may take a bath/shower during the testing period, but the recorder must not get wet and must remain within 3 feet of you. Please leave the receiver outside of the shower or tub while bathing. The monitoring period will be for 48 hours after placement of the capsule.  At the end of the 48 hours, you will return the recorder, and your diary, to our [facility] front desk.  A nurse will meet you to collect the device and answer any questions you may have.  The device should turn off once the 48 hours is complete.   What to expect after placement of the capsule:  Some patients experience a vague sensation that something is in their esophagus or  that they 'feel' the capsule when they swallow food.  Should you experience this, chewing food carefully or drinking liquids may minimize this sensation.   After the test is complete, the disposable capsule will fall off the wall of your esophagus within 5-10 days and pass naturally with your bowel movement through the digestive tract.  Once the recorder is returned, your provider will review and interpret your recordings and contact you to discuss your results.  This may take up to two weeks.   DO NOT have an MRI for 30 days after your procedure to ensure the capsule is no longer inside your body  It is imperative that you return the recorder on _________________________ by 3:00pm.  Your information must be downloaded at this time to obtain your results.       YOU HAD AN ENDOSCOPIC PROCEDURE TODAY AT THE [facility]:   Refer to the procedure report that was given to you for any specific questions about what was found during the examination.  If the procedure report does not answer your questions, please call your gastroenterologist to clarify.  If you requested that your care partner not be given the details of your procedure findings, then the procedure report has been included in a sealed envelope for you to review at your convenience later.  YOU SHOULD EXPECT: Some feelings of bloating in the abdomen. Passage of more gas than usual.  Walking can help get rid  of the air that was put into your GI tract during the procedure and reduce the bloating. If you had a lower endoscopy (such as a colonoscopy or flexible sigmoidoscopy) you may notice spotting of blood in your stool or on the toilet paper. If you underwent a bowel prep for your procedure, you may not have a normal bowel movement for a few days.  Please Note:  You might notice some irritation and congestion in your nose or some drainage.  This is from the oxygen used during your procedure.  There is no need for concern and it  should clear up in a day or so.  SYMPTOMS TO REPORT IMMEDIATELY:    Following upper endoscopy (EGD)  Vomiting of blood or coffee ground material  New chest pain or pain under the shoulder blades  Painful or persistently difficult swallowing  New shortness of breath  Fever of 100F or higher  Black, tarry-looking stools  For urgent or emergent issues, a gastroenterologist can be reached at any hour by calling [phone]. Do not use MyChart messaging for urgent concerns.    DIET:  We do recommend a small meal at first, but then you may proceed to your regular diet.  Drink plenty of fluids but you should avoid alcoholic beverages for 24 hours.  ACTIVITY:  You should plan to take it easy for the rest of today and you should NOT DRIVE or use heavy machinery until tomorrow (because of the sedation medicines used during the test).    FOLLOW UP: Our staff will call the number listed on your records 48-72 hours following your procedure to check on you and address any questions or concerns that you may have regarding the information given to you following your procedure. If we do not reach you, we will leave a message.  We will attempt to reach you two times.  During this call, we will ask if you have developed any symptoms of COVID 19. If you develop any symptoms (ie: fever, flu-like symptoms, shortness of breath, cough etc.) before then, please call [phone].  If you test positive for Covid 19 in the 2 weeks post procedure, please call and report this information to [place].    If any biopsies were taken you will be contacted by phone or by letter within the next 1-3 weeks.  Please call us at [phone] if you have not heard about the biopsies in 3 weeks.    SIGNATURES/CONFIDENTIALITY: You and/or your care partner have signed paperwork which will be entered into your electronic medical record.  These signatures attest to the fact that that the information above on your After Visit  Summary has been reviewed and is understood.  Full responsibility of the confidentiality of this discharge information lies with you and/or your care-partner.

## 2020-02-16 NOTE — Progress Notes (Signed)
Called to room to assist during endoscopic procedure.  Patient ID and intended procedure confirmed with present staff. Received instructions for my participation in the procedure from the performing physician.  

## 2020-02-16 NOTE — Op Note (Signed)
[facility] Patient Name: Melissa Rivas Procedure Date: 02/16/2020 7:23 AM MRN: [id number] Endoscopist: Doristine Locks , MD Age: 25 Referring MD:  Date of Birth: [birth year] Gender: Female Account #: [id number] Procedure:                Upper GI endoscopy with Bravo placement (off acid                            suppression therapy) Indications:              Heartburn, Suspected esophageal reflux,                            Preoperative assessment Medicines:                Monitored Anesthesia Care Procedure:                Pre-Anesthesia Assessment:                           - Prior to the procedure, a History and Physical                            was performed, and patient medications and                            allergies were reviewed. The patient's tolerance of                            previous anesthesia was also reviewed. The risks                            and benefits of the procedure and the sedation                            options and risks were discussed with the patient.                            All questions were answered, and informed consent                            was obtained. Prior Anticoagulants: The patient has                            taken no previous anticoagulant or antiplatelet                            agents. ASA Grade Assessment: II - A patient with                            mild systemic disease. After reviewing the risks                            and benefits, the patient was deemed in  satisfactory condition to undergo the procedure.                           After obtaining informed consent, the endoscope was                            passed under direct vision. Throughout the                            procedure, the patient's blood pressure, pulse, and                            oxygen saturations were monitored continuously. The                            Endoscope was introduced through the  mouth, and                            advanced to the second part of duodenum. The upper                            GI endoscopy was accomplished without difficulty.                            The patient tolerated the procedure well. Scope In: Scope Out: Findings:                 The examined esophagus was normal.                           The Z-line was regular and was found 40 cm from the                            incisors. The BRAVO capsule with delivery system                            was introduced through the mouth and advanced into                            the esophagus, such that the BRAVO pH capsule was                            positioned 34 cm from the incisors, which was 6 cm                            proximal to the GE junction. The BRAVO pH capsule                            was then deployed and attached to the esophageal                            mucosa. The delivery system was then withdrawn.  Endoscopy was utilized for probe placement and                            diagnostic evaluation. Estimated blood loss was                            minimal.                           The gastroesophageal flap valve was visualized                            endoscopically and classified as Hill Grade II                            (fold present, opens with respiration).                           Scattered mild inflammation characterized by                            erythema was found in the gastric body, at the                            incisura and in the gastric antrum. Biopsies were                            taken with a cold forceps for Helicobacter pylori                            testing. Estimated blood loss was minimal.                           The examined duodenum was normal. Complications:            No immediate complications. Estimated Blood Loss:     Estimated blood loss was minimal. Impression:               - Normal  esophagus.                           - Z-line regular, 40 cm from the incisors.                           - Gastroesophageal flap valve classified as Hill                            Grade II (fold present, opens with respiration).                           - Gastritis. Biopsied.                           - Normal examined duodenum.                           -  The BRAVO pH capsule was deployed. Recommendation:           - Patient has a contact number available for                            emergencies. The signs and symptoms of potential                            delayed complications were discussed with the                            patient. Return to normal activities tomorrow.                            Written discharge instructions were provided to the                            patient.                           - Resume previous diet today.                           - Continue holding acid suppression therapy                            (Pepcid) for the duration of the study (48 hours),                            then can resume as previously prescribed.                            Otherwise, ok to resume other present medications.                           - Await pathology results.                           - Will review Bravo results by phone.                           - Return to GI clinic at appointment to be                            scheduled. Doristine Locks, MD 02/16/2020 8:17:54 AM

## 2020-02-16 NOTE — Telephone Encounter (Signed)
I spoke with the patient.  Has had episodic chest pain/spasm, in a noncontinuous fashion.  Able to swallow, but some degree of odynophagia.  No shortness of breath, fever.  Some degree of chest pain after EGD with Bravo placement to be expected, but thankfully the vast majority do okay without need for repeat EGD with Bravo retrieval.  Advised okay to take Tylenol as needed, and to call if she develops fevers, increasing pain, trouble breathing, or other concerns.  All questions answered and was appreciative of phone call.

## 2020-02-16 NOTE — Progress Notes (Signed)
Medical history reviewed with no changes noted. VS assessed by S.M 

## 2020-02-16 NOTE — Telephone Encounter (Signed)
Patient called complaining of chest pain. She states that when swallowing food its about a 6/10 and she feels the pain from her stomach up to her ears. When she is not swallowing it is a 3/10. Please advise.

## 2020-02-16 NOTE — Progress Notes (Signed)
To PACU, VSS. Report to [email or web address] 

## 2020-02-20 ENCOUNTER — Telehealth: Payer: Self-pay

## 2020-02-20 NOTE — Telephone Encounter (Signed)
  Follow up Call-  Call back number 02/16/2020  Post procedure Call Back phone  # [phone]  Permission to leave phone message Yes  Some recent data might be hidden     Patient questions:  Do you have a fever, pain , or abdominal swelling? No. Pain Score  0 *  Have you tolerated food without any problems? Yes.    Have you been able to return to your normal activities? Yes.    Do you have any questions about your discharge instructions: Diet   No. Medications  No. Follow up visit  No.  Do you have questions or concerns about your Care? No.  Actions: * If pain score is 4 or above: No action needed, pain <4.  1. Have you developed a fever since your procedure? no  2.   Have you had an respiratory symptoms (SOB or cough) since your procedure? no  3.   Have you tested positive for COVID 19 since your procedure no  4.   Have you had any family members/close contacts diagnosed with the COVID 19 since your procedure?  no   If yes to any of these questions please route to Laverna Peace, RN and Karlton Lemon, RN

## 2020-02-20 NOTE — Telephone Encounter (Signed)
First post procedure follow up call, left message. °

## 2020-02-21 ENCOUNTER — Encounter: Payer: Self-pay | Admitting: Gastroenterology

## 2020-03-01 ENCOUNTER — Telehealth: Payer: Self-pay | Admitting: Gastroenterology

## 2020-03-01 NOTE — Telephone Encounter (Signed)
Please contact this patient to let her know that I reviewed the results of the recent Bravo study which was notable for the following: - Normal esophageal acid exposure with pH <4 only 0.8% of the time with DeMeester score 4.0 (abnormal > 14.72) - No abnormal esophageal acid exposure in the upright or supine position - No symptom correlation - Only 10 episodes of reflux in total over 48-hour study  Overall, Bravo study was negative for pathologic reflux.  Based on these results, okay to titrate off any acid suppression medications.  If she feels improvement while taking medications, such as Pepcid, okay to resume.  Otherwise, would also not recommend any antireflux surgery.  Can follow-up with me in the GI clinic when she is home from school.

## 2020-03-04 NOTE — Telephone Encounter (Signed)
Spoke to patient to inform her of recent BRAVO results and biopsy. All questions answered patient voiced understanding.

## 2020-05-07 DIAGNOSIS — Z3040 Encounter for surveillance of contraceptives, unspecified: Secondary | ICD-10-CM | POA: Diagnosis not present

## 2020-05-07 DIAGNOSIS — Z124 Encounter for screening for malignant neoplasm of cervix: Secondary | ICD-10-CM | POA: Diagnosis not present

## 2020-05-07 DIAGNOSIS — Z01419 Encounter for gynecological examination (general) (routine) without abnormal findings: Secondary | ICD-10-CM | POA: Diagnosis not present

## 2020-05-07 LAB — HM PAP SMEAR

## 2020-10-15 DIAGNOSIS — Z111 Encounter for screening for respiratory tuberculosis: Secondary | ICD-10-CM | POA: Diagnosis not present

## 2021-02-12 DIAGNOSIS — L905 Scar conditions and fibrosis of skin: Secondary | ICD-10-CM | POA: Diagnosis not present

## 2021-02-12 DIAGNOSIS — D485 Neoplasm of uncertain behavior of skin: Secondary | ICD-10-CM | POA: Diagnosis not present

## 2021-02-12 DIAGNOSIS — D225 Melanocytic nevi of trunk: Secondary | ICD-10-CM | POA: Diagnosis not present

## 2021-02-12 DIAGNOSIS — L700 Acne vulgaris: Secondary | ICD-10-CM | POA: Diagnosis not present

## 2022-01-20 ENCOUNTER — Ambulatory Visit (INDEPENDENT_AMBULATORY_CARE_PROVIDER_SITE_OTHER): Payer: Federal, State, Local not specified - PPO | Admitting: Family Medicine

## 2022-01-20 ENCOUNTER — Other Ambulatory Visit (HOSPITAL_COMMUNITY)
Admission: RE | Admit: 2022-01-20 | Discharge: 2022-01-20 | Disposition: A | Discharge status: Home / Self Care | Payer: Federal, State, Local not specified - PPO | Source: Ambulatory Visit | Attending: Family Medicine | Admitting: Family Medicine

## 2022-01-20 ENCOUNTER — Encounter: Payer: Self-pay | Admitting: Family Medicine

## 2022-01-20 VITALS — BP 113/72 | HR 76 | Temp 98.3°F | Ht 68.0 in | Wt 154.0 lb

## 2022-01-20 DIAGNOSIS — Z119 Encounter for screening for infectious and parasitic diseases, unspecified: Secondary | ICD-10-CM | POA: Diagnosis not present

## 2022-01-20 DIAGNOSIS — Z111 Encounter for screening for respiratory tuberculosis: Secondary | ICD-10-CM

## 2022-01-20 DIAGNOSIS — Z0000 Encounter for general adult medical examination without abnormal findings: Secondary | ICD-10-CM

## 2022-01-20 LAB — LIPID PANEL
Cholesterol: 169 mg/dL (ref 0–200)
HDL: 55.9 mg/dL (ref >39.00)
LDL Cholesterol: 80 mg/dL (ref 0–99)
NonHDL: 112.7
Total CHOL/HDL Ratio: 3
Triglycerides: 163 mg/dL — ABNORMAL HIGH (ref 0.0–149.0)
VLDL: 32.6 mg/dL (ref 0.0–40.0)

## 2022-01-20 LAB — COMPREHENSIVE METABOLIC PANEL
ALT: 11 U/L (ref 0–35)
AST: 17 U/L (ref 0–37)
Albumin: 4.1 g/dL (ref 3.5–5.2)
Alkaline Phosphatase: 37 U/L — ABNORMAL LOW (ref 39–117)
BUN: 10 mg/dL (ref 6–23)
CO2: 28 mEq/L (ref 19–32)
Calcium: 9.2 mg/dL (ref 8.4–10.5)
Chloride: 103 mEq/L (ref 96–112)
Creatinine, Ser: 0.82 mg/dL (ref 0.40–1.20)
GFR: 100.36 mL/min (ref >60.00)
Glucose, Bld: 95 mg/dL (ref 70–99)
Potassium: 3.9 mEq/L (ref 3.5–5.1)
Sodium: 137 mEq/L (ref 135–145)
Total Bilirubin: 0.4 mg/dL (ref 0.2–1.2)
Total Protein: 6.7 g/dL (ref 6.0–8.3)

## 2022-01-20 LAB — CBC
HCT: 37.4 % (ref 36.0–46.0)
Hemoglobin: 12.7 g/dL (ref 12.0–15.0)
MCHC: 33.8 g/dL (ref 30.0–36.0)
MCV: 88.9 fl (ref 78.0–100.0)
Platelets: 268 10*3/uL (ref 150.0–400.0)
RBC: 4.21 Mil/uL (ref 3.87–5.11)
RDW: 12.7 % (ref 11.5–15.5)
WBC: 6.9 10*3/uL (ref 4.0–10.5)

## 2022-01-20 NOTE — Patient Instructions (Addendum)

## 2022-01-20 NOTE — Progress Notes (Signed)
Patient ID: Melissa Rivas, female  DOB: May 14, 1995, 26 y.o.   MRN: [id number] Patient Care Team    Relationship Specialty Notifications Start End  Ma Hillock, DO PCP - General Family Medicine  01/24/19   Jari Pigg, MD Consulting Physician Dermatology  01/29/19   Otelia Sergeant, OD Referring Physician   01/29/19   Gastroenterology, [facility]    01/29/19   Everett Graff, MD Consulting Physician Obstetrics and Gynecology  01/20/22     Chief Complaint  Patient presents with   Annual Exam    Pt is not fasting    Subjective: Melissa Rivas is a 26 y.o.  Female  present for CPE  All past medical history, surgical history, allergies, family history, immunizations, medications and social history were updated in the electronic medical record today. All recent labs, ED visits and hospitalizations within the last year were reviewed.  Health maintenance:  Colonoscopy: PGM-colon cancer history.  Routine screening Mammogram: No family history.  Routine screening recommended Cervical cancer screening: last pap: 10/2020, has scheduled for 02/2022, completed by: Dr. Mancel Bale [facility] ob/gyn) Immunizations: tdap UTD 01/2019, Influenza 11/09/2021  (encouraged yearly) Infectious disease screening: HIV and Hep C completed today and urine cytology DEXA: Family history of osteoporosis present.  Consider early screening. Patient has a Dental home. Hospitalizations/ED visits: reviewed      01/20/2022   10:33 AM 01/27/2019   10:27 AM 01/24/2019    1:11 PM  Depression screen PHQ 2/9  Decreased Interest 0 0 0  Down, Depressed, Hopeless 0 0 0  PHQ - 2 Score 0 0 0      01/24/2019    1:11 PM  GAD 7 : Generalized Anxiety Score  Nervous, Anxious, on Edge 0  Control/stop worrying 0  Worry too much - different things 0  Trouble relaxing 0  Restless 0  Easily annoyed or irritable 0  Afraid - awful might happen 0  Total GAD 7 Score 0  Anxiety Difficulty Not difficult at all    Immunization  History  Administered Date(s) Administered   DTaP 03/13/1998, 05/06/1998, 07/10/1998, 04/04/1999, 02/23/2003   HIB (PRP-OMP) 03/13/1998, 05/06/1998, 07/10/1998, 04/04/1999   Hepatitis A 10/14/2005, 06/04/2006   Hepatitis B 07/10/1998, 09/23/1998, 01/16/2000   Hpv-Unspecified 03/10/2013, 05/11/2013, 09/11/2013   IPV 03/13/1998, 05/06/1998, 12/31/1998, 02/23/2003   Influenza,inj,Quad PF,6+ Mos 12/02/2018   Influenza-Unspecified 11/10/2018, 11/09/2021   MMR 12/31/1998, 02/23/2003   Meningococcal Conjugate 09/21/2011   Meningococcal Polysaccharide 03/22/2015   Moderna Sars-Covid-2 Vaccination 02/01/2020   Td 07/01/2009, 01/27/2019   Tdap 07/01/2009     Past Medical History:  Diagnosis Date   Allergy    Asthma    Childhood    Chicken pox    Frequent headaches    Gastroesophageal reflux disease 01/29/2019   GI bleed 2016   History of UTI    Migraines    Allergies  Allergen Reactions   Azithromycin Nausea And Vomiting   Past Surgical History:  Procedure Laterality Date   MOLE REMOVAL     Patient reports severely dysplastic mole.   UPPER GASTROINTESTINAL ENDOSCOPY     WISDOM TOOTH EXTRACTION     Family History  Problem Relation Age of Onset   Arthritis Mother    Osteoporosis Mother    Asthma Father    Arthritis Father    Diabetes Father    Hyperlipidemia Father    Hypertension Father    Colon polyps Father    Arthritis Maternal Grandmother    Stroke Maternal Grandmother  Arthritis Maternal Grandfather    Prostate cancer Maternal Grandfather    Bone cancer Maternal Grandfather    Heart disease Maternal Grandfather    Hyperlipidemia Maternal Grandfather    Hypertension Maternal Grandfather    Arthritis Paternal Grandmother    Asthma Paternal Grandmother    Mental illness Paternal Grandmother    Osteoporosis Maternal Aunt    Osteoporosis Maternal Aunt    Colon cancer Paternal Great-grandmother    Esophageal cancer Neg Hx    Social History   Social History  Narrative   Marital status/children/pets: Single.  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   Safety:      -Wears a bicycle helmet riding a bike: Yes     -smoke alarm in the home:Yes     - wears seatbelt: Yes     - Feels safe in their relationships: Yes    Allergies as of 01/20/2022       Reactions   Azithromycin Nausea And Vomiting        Medication List        Accurate as of January 20, 2022 10:54 AM. If you have any questions, ask your nurse or doctor.          STOP taking these medications    albuterol 108 (90 Base) MCG/ACT inhaler Commonly known as: VENTOLIN HFA Stopped by: Howard Pouch, DO   famotidine 20 MG tablet Commonly known as: PEPCID Stopped by: Howard Pouch, DO   omeprazole 20 MG capsule Commonly known as: PRILOSEC Stopped by: Howard Pouch, DO   saccharomyces boulardii 250 MG capsule Commonly known as: Publishing copy Stopped by: Howard Pouch, DO       TAKE these medications    lactase 3000 units tablet Commonly known as: LACTAID Take by mouth 3 (three) times daily with meals.   multivitamin tablet Take 1 tablet by mouth daily.   norgestimate-ethinyl estradiol 0.25-35 MG-MCG tablet Commonly known as: ORTHO-CYCLEN Take 1 tablet by mouth daily.   simethicone 125 MG chewable tablet Commonly known as: MYLICON Chew 497 mg by mouth every 6 (six) hours as needed for flatulence.   tretinoin 0.025 % gel Commonly known as: RETIN-A Apply topically at bedtime.        All past medical history, surgical history, allergies, family history, immunizations andmedications were updated in the EMR today and reviewed under the history and medication portions of their EMR.     No results found for this or any previous visit (from the past 2160 hour(s)).  No results found.   ROS 14 pt review of systems performed and negative (unless mentioned in an HPI)  Objective: BP 113/72   Pulse 76   Temp 98.3 F (36.8 C) (Oral)   Ht 5' 8"  (1.727 m)   Wt 154 lb (69.9 kg)   LMP 12/31/2021   SpO2 100%   BMI 23.42 kg/m  Physical Exam Vitals and nursing note reviewed.  Constitutional:      General: She is not in acute distress.    Appearance: Normal appearance. She is not ill-appearing or toxic-appearing.  HENT:     Head: Normocephalic and atraumatic.     Right Ear: Tympanic membrane, ear canal and external ear normal. There is no impacted cerumen.     Left Ear: Tympanic membrane, ear canal and external ear normal. There is no impacted cerumen.     Nose: No congestion or rhinorrhea.     Mouth/Throat:     Mouth: Mucous membranes are moist.  Pharynx: Oropharynx is clear. No oropharyngeal exudate or posterior oropharyngeal erythema.  Eyes:     General: No scleral icterus.       Right eye: No discharge.        Left eye: No discharge.     Extraocular Movements: Extraocular movements intact.     Conjunctiva/sclera: Conjunctivae normal.     Pupils: Pupils are equal, round, and reactive to light.  Cardiovascular:     Rate and Rhythm: Normal rate and regular rhythm.     Pulses: Normal pulses.     Heart sounds: Normal heart sounds. No murmur heard.    No friction rub. No gallop.  Pulmonary:     Effort: Pulmonary effort is normal. No respiratory distress.     Breath sounds: Normal breath sounds. No stridor. No wheezing, rhonchi or rales.  Chest:     Chest wall: No tenderness.  Abdominal:     General: Abdomen is flat. Bowel sounds are normal. There is no distension.     Palpations: Abdomen is soft. There is no mass.     Tenderness: There is no abdominal tenderness. There is no right CVA tenderness, left CVA tenderness, guarding or rebound.     Hernia: No hernia is present.  Musculoskeletal:        General: No swelling, tenderness or deformity. Normal range of motion.     Cervical back: Normal range of motion and neck supple. No rigidity or tenderness.     Right lower leg: No edema.     Left lower leg: No edema.   Lymphadenopathy:     Cervical: No cervical adenopathy.  Skin:    General: Skin is warm and dry.     Coloration: Skin is not jaundiced or pale.     Findings: No bruising, erythema, lesion or rash.  Neurological:     General: No focal deficit present.     Mental Status: She is alert and oriented to person, place, and time. Mental status is at baseline.     Cranial Nerves: No cranial nerve deficit.     Sensory: No sensory deficit.     Motor: No weakness.     Coordination: Coordination normal.     Gait: Gait normal.     Deep Tendon Reflexes: Reflexes normal.  Psychiatric:        Mood and Affect: Mood normal.        Behavior: Behavior normal.        Thought Content: Thought content normal.        Judgment: Judgment normal.       No results found.  Assessment/plan: Melissa Rivas is a 26 y.o. female present for CPE  Encounter for screening examination for infectious disease - Urine cytology ancillary only([place]) - HIV antibody (with reflex) - Hepatitis C Antibody Screening-pulmonary TB Quant gold Routine general medical examination at a health care facility - Comprehensive metabolic panel - CBC - Lipid panel Patient was encouraged to exercise greater than 150 minutes a week. Patient was encouraged to choose a diet filled with fresh fruits and vegetables, and lean meats. AVS provided to patient today for education/recommendation on gender specific health and safety maintenance. Colonoscopy: PGM-colon cancer history.  Routine screening Mammogram: No family history.  Routine screening recommended Cervical cancer screening: last pap: 10/2020, has scheduled for 02/2022, completed by: Dr. Mancel Bale [facility]) Immunizations: tdap UTD 01/2019, Influenza 11/09/2021  (encouraged yearly) Infectious dis  Return in about 1 year (around 01/22/2023) for cpe (20 min).  Orders Placed This Encounter  Procedures   Comprehensive metabolic panel   CBC   Lipid panel   HIV  antibody (with reflex)   Hepatitis C Antibody   QuantiFERON-TB Gold Plus   No orders of the defined types were placed in this encounter.  Referral Orders  No referral(s) requested today     Electronically signed by: Howard Pouch, [place]

## 2022-01-21 LAB — URINE CYTOLOGY ANCILLARY ONLY
Chlamydia: NEGATIVE
Comment: NEGATIVE
Comment: NORMAL
Neisseria Gonorrhea: NEGATIVE

## 2022-01-23 LAB — QUANTIFERON-TB GOLD PLUS
Mitogen-NIL: >10 IU/mL
NIL: 0.01 IU/mL
QuantiFERON-TB Gold Plus: NEGATIVE
TB1-NIL: 0 IU/mL
TB2-NIL: 0 IU/mL

## 2022-01-23 LAB — HEPATITIS C ANTIBODY: Hepatitis C Ab: NONREACTIVE

## 2022-01-23 LAB — HIV ANTIBODY (ROUTINE TESTING W REFLEX): HIV 1&2 Ab, 4th Generation: NONREACTIVE

## 2022-02-10 DIAGNOSIS — Z3040 Encounter for surveillance of contraceptives, unspecified: Secondary | ICD-10-CM | POA: Diagnosis not present

## 2022-02-10 DIAGNOSIS — Z01419 Encounter for gynecological examination (general) (routine) without abnormal findings: Secondary | ICD-10-CM | POA: Diagnosis not present

## 2022-02-11 DIAGNOSIS — D225 Melanocytic nevi of trunk: Secondary | ICD-10-CM | POA: Diagnosis not present

## 2022-02-11 DIAGNOSIS — D485 Neoplasm of uncertain behavior of skin: Secondary | ICD-10-CM | POA: Diagnosis not present

## 2022-02-11 DIAGNOSIS — D2271 Melanocytic nevi of right lower limb, including hip: Secondary | ICD-10-CM | POA: Diagnosis not present

## 2022-02-11 DIAGNOSIS — L910 Hypertrophic scar: Secondary | ICD-10-CM | POA: Diagnosis not present

## 2022-05-14 DIAGNOSIS — D2271 Melanocytic nevi of right lower limb, including hip: Secondary | ICD-10-CM | POA: Diagnosis not present

## 2022-05-14 DIAGNOSIS — D485 Neoplasm of uncertain behavior of skin: Secondary | ICD-10-CM | POA: Diagnosis not present

## 2022-05-28 DIAGNOSIS — Z4802 Encounter for removal of sutures: Secondary | ICD-10-CM | POA: Diagnosis not present

## 2022-06-25 DIAGNOSIS — L568 Other specified acute skin changes due to ultraviolet radiation: Secondary | ICD-10-CM | POA: Diagnosis not present
